# Patient Record
Sex: Male | Born: 1980 | Race: Black or African American | Hispanic: No | Marital: Single | Smoking: Current every day smoker
Health system: Southern US, Community
[De-identification: ages and names within clinical notes are randomized; demographics above are authoritative.]

## PROBLEM LIST (undated history)

## (undated) DIAGNOSIS — B20 Human immunodeficiency virus [HIV] disease: Secondary | ICD-10-CM

## (undated) DIAGNOSIS — Z21 Asymptomatic human immunodeficiency virus [HIV] infection status: Secondary | ICD-10-CM

## (undated) DIAGNOSIS — I639 Cerebral infarction, unspecified: Secondary | ICD-10-CM

---

## 2016-08-12 ENCOUNTER — Encounter (HOSPITAL_COMMUNITY): Payer: Self-pay | Admitting: *Deleted

## 2016-08-12 ENCOUNTER — Emergency Department (HOSPITAL_COMMUNITY)
Admission: EM | Admit: 2016-08-12 | Discharge: 2016-08-16 | Disposition: A | Discharge status: Home / Self Care | Payer: Self-pay | Attending: Emergency Medicine | Admitting: Emergency Medicine

## 2016-08-12 DIAGNOSIS — Z91010 Allergy to peanuts: Secondary | ICD-10-CM | POA: Insufficient documentation

## 2016-08-12 DIAGNOSIS — F17200 Nicotine dependence, unspecified, uncomplicated: Secondary | ICD-10-CM | POA: Insufficient documentation

## 2016-08-12 DIAGNOSIS — Z79899 Other long term (current) drug therapy: Secondary | ICD-10-CM | POA: Insufficient documentation

## 2016-08-12 DIAGNOSIS — Z8673 Personal history of transient ischemic attack (TIA), and cerebral infarction without residual deficits: Secondary | ICD-10-CM | POA: Insufficient documentation

## 2016-08-12 DIAGNOSIS — F39 Unspecified mood [affective] disorder: Secondary | ICD-10-CM

## 2016-08-12 DIAGNOSIS — F29 Unspecified psychosis not due to a substance or known physiological condition: Secondary | ICD-10-CM

## 2016-08-12 HISTORY — DX: Asymptomatic human immunodeficiency virus (hiv) infection status: Z21

## 2016-08-12 HISTORY — DX: Cerebral infarction, unspecified: I63.9

## 2016-08-12 HISTORY — DX: Human immunodeficiency virus (HIV) disease: B20

## 2016-08-12 LAB — COMPREHENSIVE METABOLIC PANEL
ALBUMIN: 4.2 g/dL (ref 3.5–5.0)
ALT: 16 U/L — ABNORMAL LOW (ref 17–63)
ANION GAP: 11 (ref 5–15)
AST: 21 U/L (ref 15–41)
Alkaline Phosphatase: 61 U/L (ref 38–126)
BUN: 9 mg/dL (ref 6–20)
CO2: 23 mmol/L (ref 22–32)
Calcium: 9.6 mg/dL (ref 8.9–10.3)
Chloride: 102 mmol/L (ref 101–111)
Creatinine, Ser: 1.34 mg/dL — ABNORMAL HIGH (ref 0.61–1.24)
GFR calc Af Amer: >60 mL/min (ref >60)
GFR calc non Af Amer: >60 mL/min (ref >60)
GLUCOSE: 100 mg/dL — AB (ref 65–99)
POTASSIUM: 2.7 mmol/L — AB (ref 3.5–5.1)
SODIUM: 136 mmol/L (ref 135–145)
Total Bilirubin: 0.5 mg/dL (ref 0.3–1.2)
Total Protein: 7.3 g/dL (ref 6.5–8.1)

## 2016-08-12 LAB — CBC
HCT: 40.9 % (ref 39.0–52.0)
HEMOGLOBIN: 13.5 g/dL (ref 13.0–17.0)
MCH: 29.5 pg (ref 26.0–34.0)
MCHC: 33 g/dL (ref 30.0–36.0)
MCV: 89.5 fL (ref 78.0–100.0)
PLATELETS: 248 10*3/uL (ref 150–400)
RBC: 4.57 MIL/uL (ref 4.22–5.81)
RDW: 14.4 % (ref 11.5–15.5)
WBC: 4.6 10*3/uL (ref 4.0–10.5)

## 2016-08-12 LAB — ETHANOL: Alcohol, Ethyl (B): <5 mg/dL (ref <5)

## 2016-08-12 MED ORDER — QUETIAPINE FUMARATE 200 MG PO TABS: 200.0000 mg | ORAL_TABLET | Freq: Every day | ORAL | Status: DC | PRN

## 2016-08-12 MED ORDER — ELVITEG-COBIC-EMTRICIT-TENOFAF 150-150-200-10 MG PO TABS
1.0000 | ORAL_TABLET | Freq: Every day | ORAL | Status: DC
  Administered 2016-08-13 – 2016-08-16 (×4): 1 via ORAL
  Filled 2016-08-12 (×4): qty 1

## 2016-08-12 MED ORDER — DARUNAVIR ETHANOLATE 800 MG PO TABS
800.0000 mg | ORAL_TABLET | Freq: Every day | ORAL | Status: DC
  Administered 2016-08-13 – 2016-08-16 (×4): 800 mg via ORAL
  Filled 2016-08-12 (×4): qty 1

## 2016-08-12 MED ORDER — ACETAMINOPHEN 325 MG PO TABS
650.0000 mg | ORAL_TABLET | ORAL | Status: DC | PRN
  Administered 2016-08-13: 650 mg via ORAL
  Filled 2016-08-12: qty 2

## 2016-08-12 MED ORDER — POTASSIUM CHLORIDE CRYS ER 20 MEQ PO TBCR
40.0000 meq | EXTENDED_RELEASE_TABLET | Freq: Two times a day (BID) | ORAL | Status: AC
  Administered 2016-08-12 – 2016-08-15 (×6): 40 meq via ORAL
  Filled 2016-08-12 (×7): qty 2

## 2016-08-12 MED ORDER — ONDANSETRON HCL 4 MG PO TABS: 4.0000 mg | ORAL_TABLET | Freq: Three times a day (TID) | ORAL | Status: DC | PRN

## 2016-08-12 MED ORDER — ZIPRASIDONE MESYLATE 20 MG IM SOLR
10.0000 mg | Freq: Once | INTRAMUSCULAR | Status: AC
  Administered 2016-08-12: 10 mg via INTRAMUSCULAR
  Filled 2016-08-12: qty 20

## 2016-08-12 MED ORDER — LORAZEPAM 1 MG PO TABS
1.0000 mg | ORAL_TABLET | Freq: Three times a day (TID) | ORAL | Status: DC | PRN
  Administered 2016-08-12 – 2016-08-16 (×10): 1 mg via ORAL
  Filled 2016-08-12 (×10): qty 1

## 2016-08-12 MED ORDER — IBUPROFEN 400 MG PO TABS
600.0000 mg | ORAL_TABLET | Freq: Three times a day (TID) | ORAL | Status: DC | PRN
  Administered 2016-08-12 – 2016-08-16 (×7): 600 mg via ORAL
  Filled 2016-08-12 (×7): qty 1

## 2016-08-12 NOTE — ED Notes (Signed)
Lying quietly in stretcher, awake, NAD, calmer, will continue to monitor.

## 2016-08-12 NOTE — ED Notes (Signed)
Patient unable to keep on track regarding what the problem is and why he is here.  Ranting about his boss, unsafe working conditions, set up and a plan to hurt him

## 2016-08-12 NOTE — ED Provider Notes (Signed)
[facility] DEPT Provider Note   CSN: [id number] Arrival date & time: 08/12/16  1959     History   Chief Complaint Chief Complaint  Patient presents with  . Mental Evaluation   Level V caveat due to psychiatric disorder. HPI Brian Christian is a 35 y.o. male.  HPI Patient presents for a mental evaluation. Unable to get a whole lot of history from the patient.. He does drink alcohol occasionally and smokes marijuana occasionally. States he's been having problems with his girl and his neighbors land lady. Denies hallucinations. When asked if he had a psychiatric history he said yes but will not go into it besides that. Somewhat tangential thoughts. He is HIV positive. Does not notice CD4 count is states he does take his medicines. States he had a stroke 2 weeks ago when he met this woman. States he was seen at [facility] for it. States his hand had tightened up.   Past Medical History:  Diagnosis Date  . HIV (human immunodeficiency virus infection) ([place])   . Stroke [facility])     There are no active problems to display for this patient.   No past surgical history on file.     Home Medications    Prior to Admission medications   Medication Sig Start Date End Date Taking? Authorizing Provider  Darunavir Ethanolate (PREZISTA) 800 MG tablet Take 800 mg by mouth daily.   Yes Historical Provider, MD  diazepam (VALIUM) 5 MG tablet Take 5-10 mg by mouth every 6 (six) hours as needed for anxiety.   Yes Historical Provider, MD  elvitegravir-cobicistat-emtricitabine-tenofovir (GENVOYA) 150-150-200-10 MG TABS tablet Take 1 tablet by mouth daily with breakfast.   Yes Historical Provider, MD  QUEtiapine (SEROQUEL) 400 MG tablet Take 200 mg by mouth daily as needed (sleep).   Yes Historical Provider, MD    Family History No family history on file.  Social History Social History  Substance Use Topics  . Smoking status: Current Every Day Smoker  . Smokeless tobacco: Never Used  .  Alcohol use Yes     Comment: ocassionally     Allergies   Gabapentin; Peanut-containing drug products; and Tramadol   Review of Systems Review of Systems  Unable to perform ROS: Psychiatric disorder     Physical Exam Updated Vital Signs BP 144/93 (BP Location: Right Arm)   Pulse 73   Temp 98.9 F (37.2 C) (Oral)   Resp 17   Ht _0  (1.676 m)   Wt 158 lb 6.4 oz (71.8 kg)   SpO2 100%   BMI 25.57 kg/m   Physical Exam  Constitutional: He appears well-developed.  HENT:  Head: Atraumatic.  Eyes: EOM are normal.  Neck: Neck supple.  Cardiovascular: Normal rate.   Pulmonary/Chest: Effort normal.  Abdominal: Soft.  Musculoskeletal: Normal range of motion.  Neurological: He is alert.  Skin: Skin is warm.  Psychiatric:  Pressured speech. Somewhat tearful with tangential thoughts.     ED Treatments / Results  Labs (all labs ordered are listed, but only abnormal results are displayed) Labs Reviewed  COMPREHENSIVE METABOLIC PANEL - Abnormal; Notable for the following:       Result Value   Potassium 2.7 (*)    Glucose, Bld 100 (*)    Creatinine, Ser 1.34 (*)    ALT 16 (*)    All other components within normal limits  ETHANOL  CBC  URINE RAPID DRUG SCREEN, HOSP PERFORMED  URINALYSIS, ROUTINE W REFLEX MICROSCOPIC (NOT AT [facility])  EKG  EKG Interpretation None       Radiology No results found.  Procedures Procedures (including critical care time)  Medications Ordered in ED Medications - No data to display   Initial Impression / Assessment and Plan / ED Course  I have reviewed the triage vital signs and the nursing notes.  Pertinent labs & imaging results that were available during my care of the patient were reviewed by me and considered in my medical decision making (see chart for details).  Clinical Course    Patient with likely psychiatric disorder. Has hypokalemia likely can orally be supplemented. Urine still pending but at this time is  medically cleared. To be seen by TTS. Patient apparently does have a psychiatric history and Seroquel as one of his medicines.  Final Clinical Impressions(s) / ED Diagnoses   Final diagnoses:  Psychosis, unspecified psychosis type    New Prescriptions New Prescriptions   No medications on file     Davonna Belling, MD 08/12/16 2217

## 2016-08-12 NOTE — ED Triage Notes (Signed)
Patient ranting and unable to answer questions appropriately, tearful.  Here for mental eval

## 2016-08-12 NOTE — ED Notes (Signed)
Critical K+ level called from lab 

## 2016-08-12 NOTE — ED Notes (Signed)
Pt lying in stretcher, NAD, calm, interactive, resps e/u, no dyspnea noted.

## 2016-08-12 NOTE — ED Notes (Signed)
Checked w/ Assessment @ [facility]. Approx 3 more pt's to be seen before they can do the TTS. 

## 2016-08-12 NOTE — ED Notes (Signed)
Patient again states he does not want to hurt himself or anyone else.  Patient placed back out in the waiting room.  Continues to rant about things but does not stay on track   Stated he is unable to void at this time.

## 2016-08-13 ENCOUNTER — Emergency Department (HOSPITAL_COMMUNITY): Payer: Self-pay

## 2016-08-13 DIAGNOSIS — F309 Manic episode, unspecified: Secondary | ICD-10-CM

## 2016-08-13 LAB — LIPID PANEL
CHOLESTEROL: 135 mg/dL (ref 0–200)
HDL: 45 mg/dL (ref >40)
LDL Cholesterol: 78 mg/dL (ref 0–99)
Total CHOL/HDL Ratio: 3 RATIO
Triglycerides: 61 mg/dL (ref <150)
VLDL: 12 mg/dL (ref 0–40)

## 2016-08-13 LAB — RAPID URINE DRUG SCREEN, HOSP PERFORMED
AMPHETAMINES: POSITIVE — AB
BARBITURATES: NOT DETECTED
Benzodiazepines: POSITIVE — AB
Cocaine: NOT DETECTED
Opiates: NOT DETECTED
TETRAHYDROCANNABINOL: POSITIVE — AB

## 2016-08-13 LAB — HEPATIC FUNCTION PANEL
ALBUMIN: 4 g/dL (ref 3.5–5.0)
ALK PHOS: 59 U/L (ref 38–126)
ALT: 14 U/L — ABNORMAL LOW (ref 17–63)
AST: 24 U/L (ref 15–41)
BILIRUBIN DIRECT: 0.1 mg/dL (ref 0.1–0.5)
BILIRUBIN TOTAL: 0.7 mg/dL (ref 0.3–1.2)
Indirect Bilirubin: 0.6 mg/dL (ref 0.3–0.9)
Total Protein: 7.1 g/dL (ref 6.5–8.1)

## 2016-08-13 LAB — T-HELPER CELLS (CD4) COUNT (NOT AT ARMC)
CD4 T CELL ABS: 380 /uL — AB (ref 400–2700)
CD4 T CELL HELPER: 19 % — AB (ref 33–55)

## 2016-08-13 LAB — TSH: TSH: 2.344 u[IU]/mL (ref 0.350–4.500)

## 2016-08-13 LAB — POTASSIUM: Potassium: 3.6 mmol/L (ref 3.5–5.1)

## 2016-08-13 LAB — SALICYLATE LEVEL: Salicylate Lvl: <4 mg/dL (ref 2.8–30.0)

## 2016-08-13 LAB — AMMONIA
AMMONIA: 81 umol/L — AB (ref 9–35)
Ammonia: 33 umol/L (ref 9–35)

## 2016-08-13 LAB — ACETAMINOPHEN LEVEL: Acetaminophen (Tylenol), Serum: <10 ug/mL — ABNORMAL LOW (ref 10–30)

## 2016-08-13 MED ORDER — QUETIAPINE FUMARATE 25 MG PO TABS
50.0000 mg | ORAL_TABLET | Freq: Every morning | ORAL | Status: DC
  Administered 2016-08-13 – 2016-08-15 (×3): 50 mg via ORAL
  Filled 2016-08-13 (×3): qty 2

## 2016-08-13 MED ORDER — QUETIAPINE FUMARATE ER 50 MG PO TB24: 50.0000 mg | ORAL_TABLET | ORAL | Status: DC

## 2016-08-13 MED ORDER — POTASSIUM CHLORIDE CRYS ER 20 MEQ PO TBCR
40.0000 meq | EXTENDED_RELEASE_TABLET | Freq: Once | ORAL | Status: AC
  Administered 2016-08-13: 40 meq via ORAL

## 2016-08-13 MED ORDER — GADOBENATE DIMEGLUMINE 529 MG/ML IV SOLN
15.0000 mL | Freq: Once | INTRAVENOUS | Status: AC | PRN
  Administered 2016-08-13: 15 mL via INTRAVENOUS

## 2016-08-13 MED ORDER — STERILE WATER FOR INJECTION IJ SOLN
INTRAMUSCULAR | Status: AC
  Filled 2016-08-13: qty 10

## 2016-08-13 MED ORDER — ZIPRASIDONE MESYLATE 20 MG IM SOLR
10.0000 mg | Freq: Once | INTRAMUSCULAR | Status: AC
  Administered 2016-08-13: 10 mg via INTRAMUSCULAR
  Filled 2016-08-13: qty 20

## 2016-08-13 MED ORDER — PANTOPRAZOLE SODIUM 40 MG PO TBEC
40.0000 mg | DELAYED_RELEASE_TABLET | Freq: Every day | ORAL | Status: DC
  Administered 2016-08-13 – 2016-08-16 (×4): 40 mg via ORAL
  Filled 2016-08-13 (×5): qty 1

## 2016-08-13 MED ORDER — POTASSIUM CHLORIDE IN NACL 20-0.9 MEQ/L-% IV SOLN
Freq: Once | INTRAVENOUS | Status: AC
Start: 2016-08-13 — End: 2016-08-13
  Administered 2016-08-13: 03:00:00 via INTRAVENOUS
  Filled 2016-08-13: qty 1000

## 2016-08-13 MED ORDER — NICOTINE 14 MG/24HR TD PT24
14.0000 mg | MEDICATED_PATCH | Freq: Once | TRANSDERMAL | Status: AC
  Administered 2016-08-13: 14 mg via TRANSDERMAL
  Filled 2016-08-13: qty 1

## 2016-08-13 MED ORDER — QUETIAPINE FUMARATE 25 MG PO TABS
100.0000 mg | ORAL_TABLET | Freq: Every day | ORAL | Status: DC
  Administered 2016-08-13 – 2016-08-15 (×3): 100 mg via ORAL
  Filled 2016-08-13 (×3): qty 4

## 2016-08-13 NOTE — ED Notes (Signed)
TTS in process 

## 2016-08-13 NOTE — ED Notes (Signed)
MD advised to stop Potassium

## 2016-08-13 NOTE — ED Provider Notes (Signed)
9:22 PM   Nursing came to report the patient was having some reflux. He reports he has this chronically and normally takes Prilosec. He says it was worse after he eight and he laid down causing some mild burning. Patient denies chest pain, shortness of breath, or other complaints at this time.  Patient examined and had no chest tenderness, abnormalities a lung exam, or other abnormal physical findings.  Based on options available at this facility, patient given dose of protonix.   Patient had no other complaints and was resting calmly.   Canary Brimhristopher J Lena Gores, MD 08/14/16 [phone]

## 2016-08-13 NOTE — ED Notes (Signed)
Patient having conversation with staff and laughing inappropriately

## 2016-08-13 NOTE — ED Notes (Signed)
No changes, to MRI, alert, NAD, calm, cooperative, polite.

## 2016-08-13 NOTE — ED Notes (Addendum)
[facility] at [facility], pt alert, NAD, interactive, cooperative, participatory. Speech pressured, thoughts scattered, hyperfocused on boss, persecution of self and others (particularly a neighbor child), BH contacted to initiate TTS, pending return call. Preparing to re-draw potassium.

## 2016-08-13 NOTE — ED Notes (Signed)
This RN in room with patient. Pt aroused by voice and states "I feel okay" Pt comfortable. HR sinus brady at 46. Pt does not appear to be in any distress

## 2016-08-13 NOTE — ED Notes (Signed)
Back from MRI, states, "HA is better", noted return/increase in rapid speech, pressured speech, ruminating on boss, work, terrible things, needing phone, given happy meal per request, phone use times explained, this RN listened, encouraged, reassured.

## 2016-08-13 NOTE — ED Notes (Signed)
Pt first on the list for TTS after shift change, resting quietly at this time.

## 2016-08-13 NOTE — ED Notes (Signed)
Gave pt snack  

## 2016-08-13 NOTE — ED Notes (Signed)
Sleeping, NAD, calm, no changes, VSS.

## 2016-08-13 NOTE — ED Notes (Signed)
Patient states he is having anxiety

## 2016-08-13 NOTE — ED Provider Notes (Signed)
I assumed care for Dr. Rubin PayorPickering for further evaluation by psychiatry. I have reassessed the patient and preceded with CT had to rule out any intracranial injury. Patient had history of HIV but not known to be immunocompromised. CT did show a small irregularity with recommendation for follow-up MRI. MRI shows previous stroke consistent with patient's history but no areas of abscess or other complications. Patient was also hypokalemic. This has been replaced orally and by fluids. Patient's potassium is now normalized. Patient does report at baseline he is supposed to be taking supplemental potassium but is noncompliant.  Now this morning after hydration and completion of diagnostic evaluation, vital signs are stable, patient is alert and interactive. His speech is pressured but clear and goal oriented. He does not show any signs of toxicity. His movements are coordinated purposeful symmetric.  Patient is medically cleared for psychiatric evaluation and final disposition.   Arby BarretteMarcy Lila Lufkin, MD 08/13/16 [phone]

## 2016-08-13 NOTE — ED Notes (Signed)
Back from CT & xray, remains preoccupied with sleep, no changes.

## 2016-08-13 NOTE — Progress Notes (Signed)
Patient has been recommended inpatient treatment by Dr. Jama Flavorsobos on 9/29. There are no appropriate beds for patient at [facility]. Referrals have been faxed to the following hospitals:  [facility] - per Misty StanleyStacey, accepting referrals tonight. [facility] - per Dennie BiblePat, accepting referrals. [facility] - per Marguarite ArbourPatrisa [facility] - per Lonna CobbSherry [facility] - at capacity but will look at referral  [facility] - per Cici, can not take patient due to pt being out of catchment area ([place] lives in [place] [facility] - at capacity, per [facility]  CSW will continue to follow up with placement.  Melbourne Abtsatia Tino Ronan, LCSWA Disposition staff 08/13/2016 9:31 PM

## 2016-08-13 NOTE — ED Notes (Signed)
Meal tray ordered 

## 2016-08-13 NOTE — Consult Note (Signed)
[facility] Psychiatry Consult   Reason for Consult:  Manic symptoms Referring Physician:  ED Physician Patient Identification: Brian Christian MRN:  [id number] Principal Diagnosis: Mood Disorder NOS Diagnosis:  There are no active problems to display for this patient.   Total Time spent with patient: 30 minutes  Subjective:   Brian Christian is a 35 y.o. male patient admitted with pressured speech, disorganized thought process. At this time patient presents as a poor historian . He presents with pressured speech and presents with a rambling , disorganized,  difficult to follow account of recent events.  He states that he lives alone , states he has been concerned about a neighbor's child, because the mother stole a $ 5000 dollar jewelry item from him, so he has been trying to adopt him and also a dog named " Sparky", which he has been bringing food for.  He presents preoccupied and perseverates about these topics. He does have some insight , and acknowledges his current presentation is different from his normal, and acknowledges racing thoughts. He states he has been sleeping well. He is not irritable or angry, but does present with an expansive and jovial affect , frequently laughing during our session .   HPI: Brian Christian is a 35 y.o. male patient admitted with pressured speech, disorganized thought process. At this time patient presents as a poor historian . He presents with pressured speech and presents with a rambling , disorganized,  difficult to follow account of recent events.  He states that he lives alone , states he has been concerned about a neighbor's child, because the mother stole a $ 5000 dollar jewelry item from him, so he has been trying to adopt him and also a dog named " Sparky", which he has been bringing food for.  He states he is worried because he has had " 7 strokes " over the last 2-3 days . He presents preoccupied and perseverates about these topics. He does have  some insight , and acknowledges his current presentation is different from his normal, and acknowledges racing thoughts. RN staff reports that patient himself has been requesting medication to help with his racing thoughts . He states he has been sleeping well. He is not irritable or angry, but does present with an expansive and jovial affect , frequently laughing during our session . Of note patient denies any substance abuse history, or recent substance use , but admission UDS is positive for amphetamines, benzodiazepines, and cannabis. 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  A Head CT scan done 9/29 was reported as -  Small low attenuating foci in the left basal ganglia may represent an age indeterminate infarct. An infectious process or abscess is not excluded. 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 Otherwise negative MRI head with and without contrast. I have discussed case with ED physician- considered medically cleared at present.   Past Psychiatric History: patient is denying any prior psychiatric history, states he has never before been hospitalized in a psychiatric unit, denies history of depression or prior episodes of mania , denies history of psychosis, denies history of panic or agoraphobia, denies history of violence. He does mention he had taken Seroquel in the past , cannot specify reason, but it is unclear if he was compliant . States he  found it to be sedating , but otherwise denies side effects.  PTA medication list indicates Valium 5 mgrs Q 6 hours PRN , and Seroquel 200 mgrs QHS PRN . As noted, patient is currently denying any substance abuse history but UDS is positive.  Risk to Self: Suicidal Ideation: No Suicidal Intent: No Is patient at risk for suicide?: No Suicidal Plan?: No Access to  Means: No What has been your use of drugs/alcohol within the last 12 months?: pt denies...UDS not completed Intentional Self Injurious Behavior: None Risk to Others: Homicidal Ideation: No Thoughts of Harm to Others: No Current Homicidal Intent: No Current Homicidal Plan: No Access to Homicidal Means: No History of harm to others?: No Assessment of Violence: None Noted Does patient have access to weapons?: No Criminal Charges Pending?: No Does patient have a court date: No Prior Inpatient Therapy: Prior Inpatient Therapy: Yes Prior Therapy Dates: @ 4 years ago Prior Therapy Facilty/Provider(s): [facility] Reason for Treatment: unsure Prior Outpatient Therapy: Prior Outpatient Therapy: No Does patient have an ACCT team?: Unknown Does patient have Intensive In-House Services?  : No Does patient have [facility] services? : No Does patient have P4CC services?: No  Past Medical History:  Past Medical History:  Diagnosis Date  . HIV (human immunodeficiency virus infection) ([facility])   . Stroke [facility])    No past surgical history on file. Family History: No family history on file. 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 Has one daughter, currently in college.  Denies legal difficulties.  History  Alcohol Use  . Yes    Comment: ocassionally     History  Drug Use  . Types: Marijuana    Social History   Social History  . Marital status: Single    Spouse name: N/A  . Number of children: N/A  . Years of education: N/A   Social History Main Topics  . Smoking status: Current Every Day Smoker  . Smokeless tobacco: Never Used  . Alcohol use Yes     Comment: ocassionally  . Drug use:     Types: Marijuana  . Sexual activity: Not Asked   Other Topics Concern  . None   Social History Narrative  . None   Additional Social History:    Allergies:   Allergies  Allergen Reactions  .  Gabapentin Swelling  . Peanut-Containing Drug Products Swelling  . Tramadol Other (See Comments)    dizzy    Labs:  Results for orders placed or performed during the hospital encounter of 08/12/16 (from the past 48 hour(s))  Comprehensive metabolic panel     Status: Abnormal   Collection Time: 08/12/16  8:43 PM  Result Value Ref Range   Sodium 136 135 - 145 mmol/L   Potassium 2.7 (LL) 3.5 - 5.1 mmol/L    Comment: CRITICAL RESULT CALLED TO, READ BACK BY AND VERIFIED WITH: K. FIELDS RN [phone] GREEN R    Chloride 102 101 - 111 mmol/L   CO2 23 22 - 32 mmol/L   Glucose, Bld 100 (H) 65 - 99 mg/dL   BUN 9 6 - 20 mg/dL   Creatinine, Ser 2.10 (H) 0.61 - 1.24 mg/dL   Calcium 9.6 8.9 - 37.4 mg/dL   Total Protein 7.3 6.5 - 8.1 g/dL   Albumin 4.2 3.5 - 5.0 g/dL   AST 21 15 - 41 U/L   ALT 16 (L) 17 - 63  U/L   Alkaline Phosphatase 61 38 - 126 U/L   Total Bilirubin 0.5 0.3 - 1.2 mg/dL   GFR calc non Af Amer >60 >60 mL/min   GFR calc Af Amer >60 >60 mL/min    Comment: (NOTE) The eGFR has been calculated using the CKD EPI equation. This calculation has not been validated in all clinical situations. eGFR's persistently <60 mL/min signify possible Chronic Kidney Disease.    Anion gap 11 5 - 15  cbc     Status: None   Collection Time: 08/12/16  8:43 PM  Result Value Ref Range   WBC 4.6 4.0 - 10.5 K/uL   RBC 4.57 4.22 - 5.81 MIL/uL   Hemoglobin 13.5 13.0 - 17.0 g/dL   HCT 07.2 25.7 - 50.5 %   MCV 89.5 78.0 - 100.0 fL   MCH 29.5 26.0 - 34.0 pg   MCHC 33.0 30.0 - 36.0 g/dL   RDW 18.3 35.8 - 25.1 %   Platelets 248 150 - 400 K/uL  Ethanol     Status: None   Collection Time: 08/12/16  8:44 PM  Result Value Ref Range   Alcohol, Ethyl (B) <5 <5 mg/dL    Comment:        LOWEST DETECTABLE LIMIT FOR SERUM ALCOHOL IS 5 mg/dL FOR MEDICAL PURPOSES ONLY   Acetaminophen level     Status: Abnormal   Collection Time: 08/13/16  3:00 AM  Result Value Ref Range   Acetaminophen (Tylenol),  Serum <10 (L) 10 - 30 ug/mL    Comment:        THERAPEUTIC CONCENTRATIONS VARY SIGNIFICANTLY. A RANGE OF 10-30 ug/mL MAY BE AN EFFECTIVE CONCENTRATION FOR MANY PATIENTS. HOWEVER, SOME ARE BEST TREATED AT CONCENTRATIONS OUTSIDE THIS RANGE. ACETAMINOPHEN CONCENTRATIONS >150 ug/mL AT 4 HOURS AFTER INGESTION AND >50 ug/mL AT 12 HOURS AFTER INGESTION ARE OFTEN ASSOCIATED WITH TOXIC REACTIONS.   Hepatic function panel     Status: Abnormal   Collection Time: 08/13/16  3:00 AM  Result Value Ref Range   Total Protein 7.1 6.5 - 8.1 g/dL   Albumin 4.0 3.5 - 5.0 g/dL   AST 24 15 - 41 U/L   ALT 14 (L) 17 - 63 U/L   Alkaline Phosphatase 59 38 - 126 U/L   Total Bilirubin 0.7 0.3 - 1.2 mg/dL   Bilirubin, Direct 0.1 0.1 - 0.5 mg/dL   Indirect Bilirubin 0.6 0.3 - 0.9 mg/dL  Salicylate level     Status: None   Collection Time: 08/13/16  3:00 AM  Result Value Ref Range   Salicylate Lvl <4.0 2.8 - 30.0 mg/dL  Ammonia     Status: Abnormal   Collection Time: 08/13/16  3:00 AM  Result Value Ref Range   Ammonia 81 (H) 9 - 35 umol/L  T-helper cells (CD4) count (not at [facility])     Status: Abnormal   Collection Time: 08/13/16  4:11 AM  Result Value Ref Range   CD4 T Cell Abs 380 (L) 400 - 2,700 /uL   CD4 % Helper T Cell 19 (L) 33 - 55 %    Comment: Performed at [facility]  Potassium     Status: None   Collection Time: 08/13/16  6:25 AM  Result Value Ref Range   Potassium 3.6 3.5 - 5.1 mmol/L  Ammonia     Status: None   Collection Time: 08/13/16  6:26 AM  Result Value Ref Range   Ammonia 33 9 - 35 umol/L  Rapid  urine drug screen (hospital performed)     Status: Abnormal   Collection Time: 08/13/16  2:03 PM  Result Value Ref Range   Opiates NONE DETECTED NONE DETECTED   Cocaine NONE DETECTED NONE DETECTED   Benzodiazepines POSITIVE (A) NONE DETECTED   Amphetamines POSITIVE (A) NONE DETECTED   Tetrahydrocannabinol POSITIVE (A) NONE DETECTED   Barbiturates NONE DETECTED  NONE DETECTED    Comment:        DRUG SCREEN FOR MEDICAL PURPOSES ONLY.  IF CONFIRMATION IS NEEDED FOR ANY PURPOSE, NOTIFY LAB WITHIN 5 DAYS.        LOWEST DETECTABLE LIMITS FOR URINE DRUG SCREEN Drug Class       Cutoff (ng/mL) Amphetamine      1000 Barbiturate      200 Benzodiazepine   200 Tricyclics       300 Opiates          300 Cocaine          300 THC              50     Current Facility-Administered Medications  Medication Dose Route Frequency Provider Last Rate Last Dose  . acetaminophen (TYLENOL) tablet 650 mg  650 mg Oral Q4H PRN Benjiman Core, MD   650 mg at 08/13/16 0254  . Darunavir Ethanolate (PREZISTA) tablet 800 mg  800 mg Oral Q breakfast Benjiman Core, MD   Stopped at 08/13/16 [phone]  . elvitegravir-cobicistat-emtricitabine-tenofovir (GENVOYA) 150-150-200-10 MG tablet 1 tablet  1 tablet Oral Q breakfast Benjiman Core, MD   Stopped at 08/13/16 0815  . ibuprofen (ADVIL,MOTRIN) tablet 600 mg  600 mg Oral Q8H PRN Benjiman Core, MD   600 mg at 08/13/16 1148  . LORazepam (ATIVAN) tablet 1 mg  1 mg Oral Q8H PRN Benjiman Core, MD   1 mg at 08/13/16 0548  . nicotine (NICODERM CQ - dosed in mg/24 hours) patch 14 mg  14 mg Transdermal Once Gerhard Munch, MD   14 mg at 08/13/16 1356  . ondansetron (ZOFRAN) tablet 4 mg  4 mg Oral Q8H PRN Benjiman Core, MD      . potassium chloride SA (K-DUR,KLOR-CON) CR tablet 40 mEq  40 mEq Oral BID Benjiman Core, MD   40 mEq at 08/13/16 1014  . QUEtiapine (SEROQUEL) tablet 200 mg  200 mg Oral Daily PRN Benjiman Core, MD      . sterile water (preservative free) injection            Current Outpatient Prescriptions  Medication Sig Dispense Refill  . Darunavir Ethanolate (PREZISTA) 800 MG tablet Take 800 mg by mouth daily.    . diazepam (VALIUM) 5 MG tablet Take 5-10 mg by mouth every 6 (six) hours as needed for anxiety.    . elvitegravir-cobicistat-emtricitabine-tenofovir (GENVOYA) 150-150-200-10 MG TABS tablet Take 1  tablet by mouth daily with breakfast.    . QUEtiapine (SEROQUEL) 400 MG tablet Take 200 mg by mouth daily as needed (sleep).      Musculoskeletal: Strength & Muscle Tone: within normal limits Gait & Station: gait not examined  Patient leans: N/A  Psychiatric Specialty Exam: Physical Exam  ROS denies headache, denies chest pain, denies shortness of breath, denies vomiting   Blood pressure 119/70, pulse 71, temperature 98.9 F (37.2 C), temperature source Oral, resp. rate 17, height 5\' 6"  (1.676 m), weight 158 lb 6.4 oz (71.8 kg), SpO2 100 %.Body mass index is 25.57 kg/m.  General Appearance: fairly groomed in hospital garb   Eye  Contact:  Good  Speech:  Pressured  Volume:  Normal  Mood:  states mood is " good "  Affect:  expansive, overly jovial   Thought Process:  Disorganized  Orientation:  Full (Time, Place, and Person)  Thought Content:  denies hallucinations , does not appear internally preoccupied   Suicidal Thoughts:  No denies any suicidal or self injurious ideations, denies any homicidal or violent ideations  Homicidal Thoughts:  No  Memory:  recent and remote grossly intact   Judgement:  Impaired  Insight:  Fair   Psychomotor Activity:  Mildly restless, exaggerated hand gestures   Concentration:  Concentration: Fair and Attention Span: Fair  Recall:  Good  Fund of Knowledge:  Good  Language:  Good  Akathisia:  Negative  Handed:  Right  AIMS (if indicated):     Assets:  Desire for Improvement Resilience  ADL's:   Fair   Cognition:  WNL  Sleep:       Assessment - patient is presenting with symptoms of mania . He denies prior psychiatric history  or substance abuse,but he presents as fair historian, and chart notes indicate he had been prescribed Seroquel, and UDS is positive for cannabis and amphetamines . Would consider stimulant induced mania as a potential etiology.  Treatment Plan Summary: as below   Disposition: Recommend psychiatric Inpatient admission  when medically cleared. -  At this time continue SEROQUEL 50 mgrs QAM and 100 mgrs QHS   -  Continue ATIVAN 1 mgr Q 6 hours PRN for agitation   - please  Obtain Lipid panel, Prolactin, HgbA1C,  EKG to monitor QTc, ( routine as on Seroquel ) , and TSH  Neita Garnet, MD 08/13/2016 3:01 PM

## 2016-08-13 NOTE — BH Assessment (Signed)
[facility] Assessment Progress Note  Unable to assess patient due to recent dose of geodon. Will call back when patient is alert.

## 2016-08-13 NOTE — ED Notes (Signed)
Patient aware we need a Urinalysis, but unable to void at this time. 

## 2016-08-13 NOTE — ED Notes (Signed)
Meal eaten, potassium taken, pt cooperative, polite, NAD, preoccupied with sleep at this time, to xray and CT via stretcher. Pt agreeable, verbalizes understanding.

## 2016-08-13 NOTE — ED Notes (Signed)
Dr. Low[facility]Pheiffer in to see pt[facility]

## 2016-08-13 NOTE — ED Notes (Signed)
Moved to room C29, sitting upright eating, VSS, no dyspnea, alert, interactive, voice mumbling and clear, NAD, calm.

## 2016-08-13 NOTE — ED Notes (Signed)
RN spoke with MD advised patient can have ibuprofen for pain.

## 2016-08-13 NOTE — ED Provider Notes (Signed)
MEDICALLY CLEAR FOR TRANSFER OR PSYCHIATRIC ADMISSION.    Lyndal Pulleyaniel Kennidee Heyne, MD 08/13/16 [id number]

## 2016-08-13 NOTE — ED Notes (Signed)
Patient was given 2 drinks and snack, and double portions ordered for lunch.
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
## 2016-08-13 NOTE — ED Notes (Signed)
VSS. Pt sleeping, NAD, calm, arousable to voice, rise and fall of chest noted, repositions self, lying on L side in fetal position.

## 2016-08-14 ENCOUNTER — Encounter (HOSPITAL_COMMUNITY): Payer: Self-pay

## 2016-08-14 MED ORDER — LORAZEPAM 1 MG PO TABS
1.0000 mg | ORAL_TABLET | Freq: Once | ORAL | Status: AC
  Administered 2016-08-14: 1 mg via ORAL
  Filled 2016-08-14: qty 1

## 2016-08-14 MED ORDER — NICOTINE 21 MG/24HR TD PT24
21.0000 mg | MEDICATED_PATCH | Freq: Every day | TRANSDERMAL | Status: DC
  Administered 2016-08-14 – 2016-08-16 (×3): 21 mg via TRANSDERMAL
  Filled 2016-08-14 (×3): qty 1

## 2016-08-14 NOTE — ED Notes (Signed)
Pt requesting ativan  given

## 2016-08-14 NOTE — ED Notes (Addendum)
Pt eating breakfast. Pt noted w/rapid speech. States he was sent to ED from his work d/t upset about neighbor's 10-y-o son who he wants to adopt. States he is aware he is in the ED and states "as long as I am out of here by Tuesday, I'll be OK". States usually takes Valium but has been receiving Ativan while in ED and feels it is working better. States was afraid he may not sleep at night d/t not being given his usual dosage Seroquel 400mg  but slept well.

## 2016-08-14 NOTE — ED Notes (Signed)
Discussed the ativan given at 1934 with dr Jeraldine Lootslockwood

## 2016-08-14 NOTE — ED Notes (Signed)
Pt eating another plate of food

## 2016-08-14 NOTE — ED Notes (Signed)
[facility] Simon[facility] ComeLeo[facility] looking for [facility].

## 2016-08-14 NOTE — ED Notes (Signed)
Pt at desk very vocally upset and worried about neighbors child. Pt has request to talk to Social Worker on Monday for a welfare check on the child. Pt states he is going to use the bathroom then try to get some sleep the rest of the night.

## 2016-08-14 NOTE — ED Notes (Signed)
Lunch delivered. 

## 2016-08-14 NOTE — ED Notes (Signed)
Pt aware pecans are in carrot cake, as requested by [employer]. Pt states he can eat pecans and voiced appreciation.

## 2016-08-14 NOTE — ED Notes (Signed)
Pt c/o headache medication orders given as per PRN order pt sitting in room appears in no distress

## 2016-08-14 NOTE — ED Notes (Signed)
Patient became upset because he wasn't allowed to have any more soda... Patient was informed about the rules in Pod C.... Patient was offered water and declined stating that medically he wasn't able to drink water due to being an ex cancer patient..Marland Kitchen

## 2016-08-14 NOTE — ED Notes (Signed)
Pt given Medical Clearance pt policy form and verbalized understanding of policies. States he does not like to drink water. Pt also given sandwich and cup of ice as requested for snack. Pt has Coke at bedside. Lunch order request received from pt - ordered.

## 2016-08-14 NOTE — ED Notes (Signed)
Pt taking shower.  

## 2016-08-14 NOTE — ED Notes (Signed)
Pt eating sandwich given earlier - asking for Coke and ice. Advised pt may have Coke when lunch tray arrives - ice chips given as requested.

## 2016-08-15 LAB — HEMOGLOBIN A1C
HEMOGLOBIN A1C: 5.7 % — AB (ref 4.8–5.6)
Mean Plasma Glucose: 117 mg/dL

## 2016-08-15 NOTE — ED Notes (Signed)
Pt ambulatory to nurses' desk - asking if he has to wait until he is discharged to get his cell phone back. Advised pt yes. States wants "to be released from hospital tomorrow because I'll get my check". States has appt on Tues at 11:30am and wants to make sure he is out tomorrow so he can make it to that appt. States he "may want to go ahead and take my Seroquel so I can get some rest but then again, maybe I'll wait because I'm watching a funny movie". Requested for RN to "let them know I'll be ready to leave in the morning". Pt then returned to his room.

## 2016-08-15 NOTE — ED Notes (Signed)
Snack given: [place] sandwich Lunch order taken

## 2016-08-15 NOTE — ED Notes (Signed)
Pt on phone at nurses' desk. Advised pt moving to room C21 - voiced understanding.
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
## 2016-08-15 NOTE — ED Notes (Signed)
Pt's mother, Kari BaarsRachel Penn, called - advised her pt may return her call in am d/t has had phone calls for the day - [phone]. Advised she will call back in the morning.

## 2016-08-15 NOTE — ED Notes (Signed)
Requesting to take a shower.  Linen and supplies provided.  No distress noted at this time.

## 2016-08-15 NOTE — ED Notes (Signed)
Pt has been pressing call bell multiple times this am asking repetitive questions. Pt given comb as requested when called out this time.

## 2016-08-15 NOTE — ED Notes (Addendum)
Pt noted to be anxious this am. Voicing concern re: being given Ativan 1mg  rather than Valium 10mg  and being given Seroquel 50mg  in am. Is requesting to be given extra Ativan "to keep me calm" and that the Seroquel makes him feel too sleepy during the day. States wants to take at least 200mg  qhs rather than 100mg . States he feels OK today and is requesting to be able to be d/c'd to home. Advised pt will request TTS to consult. Voiced understanding. Pt then called RN back into room to discuss same concerns. RN allowed pt to vent feelings/concerns and advised will consult w/TTS. Voiced understanding. Shower supplies given as requested. Pt took shower around 0300 this am but is requesting to take 2nd. Pt now resting on bed while waiting for shower to be cleaned.

## 2016-08-15 NOTE — ED Notes (Addendum)
Pt noted to be anxious - asking for Ativan repeatedly. States he has been awake since 0500 this am and needs it to relax - however, pt refusing Seroquel d/t states makes him sleepy and too relaxed.

## 2016-08-15 NOTE — ED Notes (Signed)
Pt on phone at nurses' desk - advised pt has been on phone over 5-minute limit. Pt cooperative and returned to room as asked by RN.

## 2016-08-15 NOTE — ED Notes (Signed)
Ordered breakfast tray at 0550- TY

## 2016-08-16 DIAGNOSIS — F17210 Nicotine dependence, cigarettes, uncomplicated: Secondary | ICD-10-CM

## 2016-08-16 DIAGNOSIS — F1099 Alcohol use, unspecified with unspecified alcohol-induced disorder: Secondary | ICD-10-CM

## 2016-08-16 DIAGNOSIS — Z79899 Other long term (current) drug therapy: Secondary | ICD-10-CM

## 2016-08-16 DIAGNOSIS — Z791 Long term (current) use of non-steroidal anti-inflammatories (NSAID): Secondary | ICD-10-CM

## 2016-08-16 DIAGNOSIS — F39 Unspecified mood [affective] disorder: Secondary | ICD-10-CM

## 2016-08-16 DIAGNOSIS — F1290 Cannabis use, unspecified, uncomplicated: Secondary | ICD-10-CM

## 2016-08-16 LAB — PROLACTIN: PROLACTIN: 8.6 ng/mL (ref 4.0–15.2)

## 2016-08-16 MED ORDER — QUETIAPINE FUMARATE 50 MG PO TABS: ORAL_TABLET | ORAL | 0 refills | Status: DC

## 2016-08-16 NOTE — ED Provider Notes (Signed)
10:56 AM Patient does not appear acutely psychotic. He is resting comfortably. We'll place back on Seroquel, 500 in the morning and 100 mg at night per the psychiatry recommendations. However he no longer meets inpatient criteria per them. No thoughts of homicidal or suicidal thoughts. Follow-up with psychiatry.   Pricilla LovelessScott Nachum Derossett, MD 08/16/16 1057

## 2016-08-16 NOTE — ED Notes (Signed)
Patient was given a snack and drink, and a regular diet ordered for lunch. 
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
## 2017-04-07 IMAGING — CT CT HEAD W/O CM
3 series · 15 of 47 positions shown, 18 images · non-contrast
Comparison: None.

CLINICAL DATA: 35-year-old male with confusion. History of stroke
and HIV.

EXAM:
CT HEAD WITHOUT CONTRAST
TECHNIQUE: Contiguous axial images were obtained from the base of the skull
through the vertex without intravenous contrast.

[Series 2: head 5.0 h30s · axial · 0.41mm/px · z∈[-56,+74]mm · 9 of 32 slices shown, 12 images]
[im 3/32  brain]
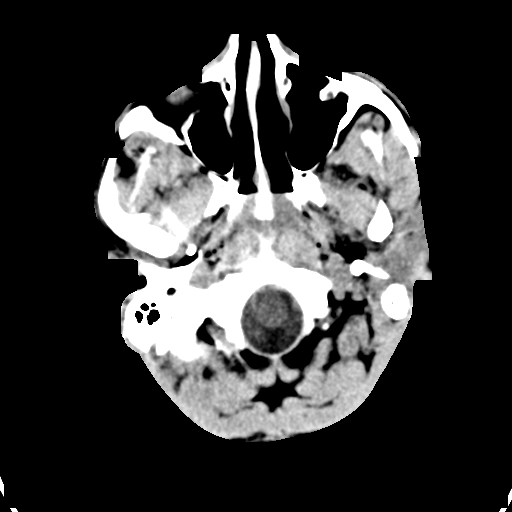
[im 3/32  bone]
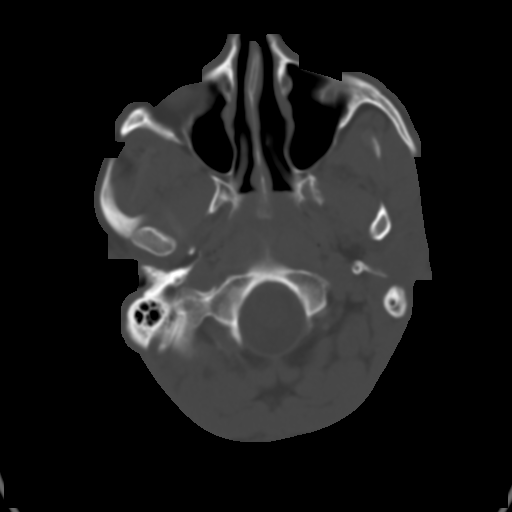
[im 6/32  brain]
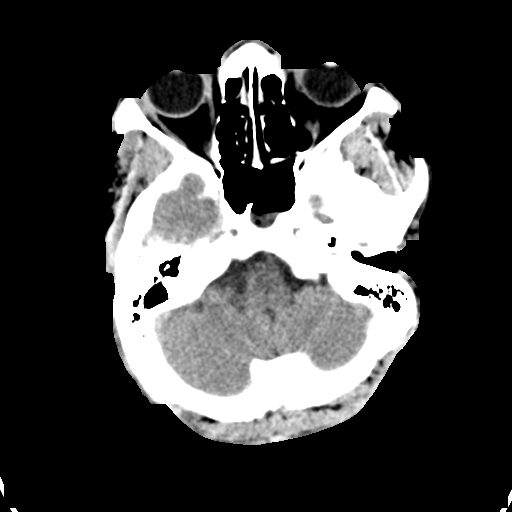
[im 9/32  brain]
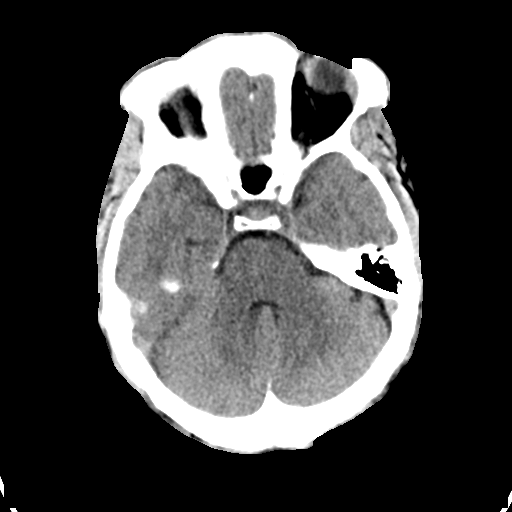
[im 12/32  brain]
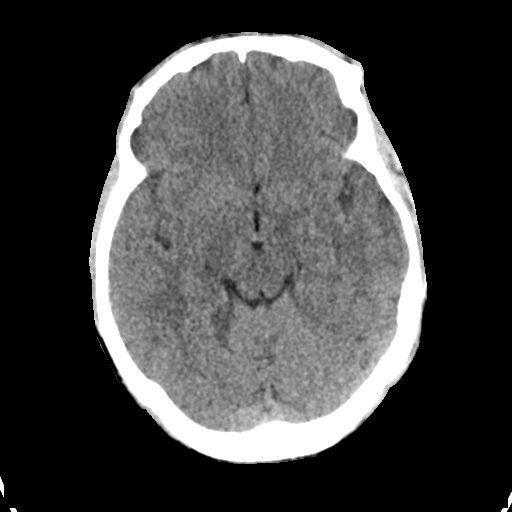
[im 17/32  brain]
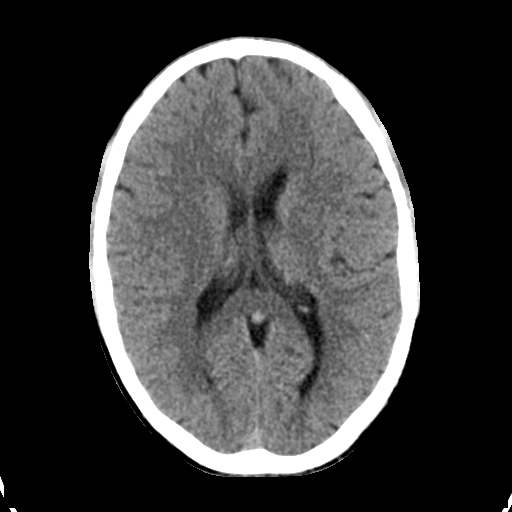
[im 17/32  bone]
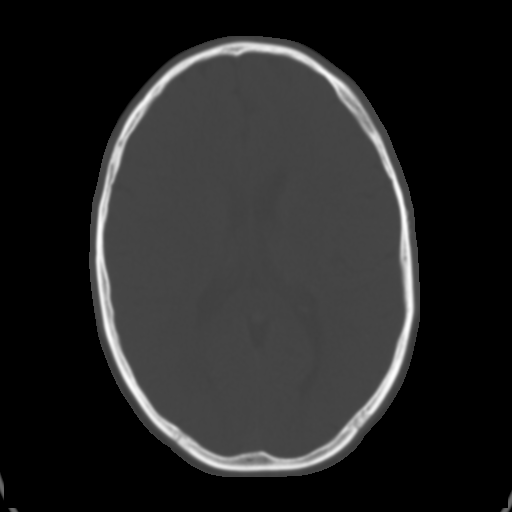
[im 20/32  brain]
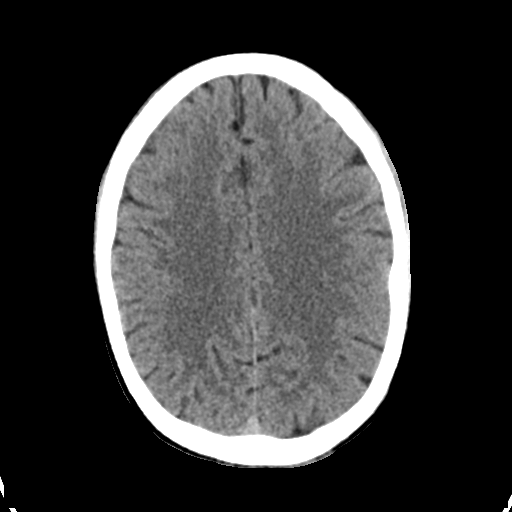
[im 23/32  brain]
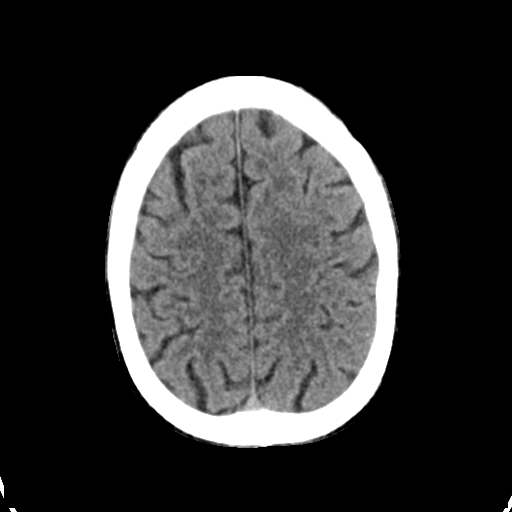
[im 26/32  brain]
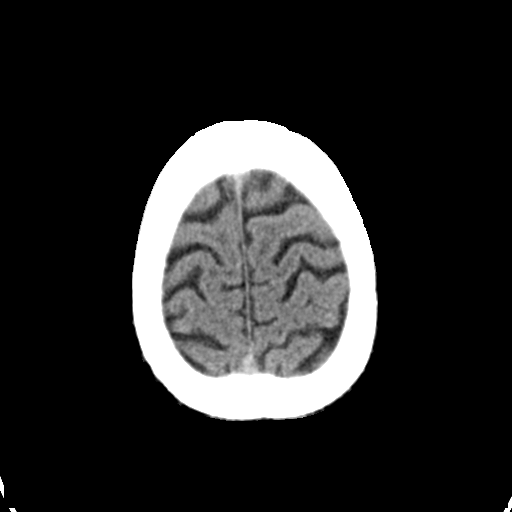
[im 29/32  brain]
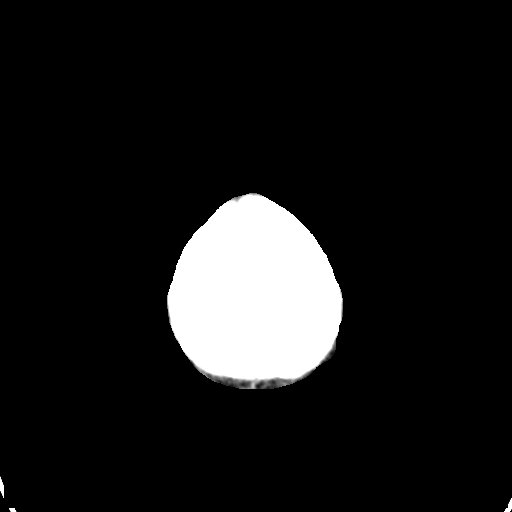
[im 29/32  bone]
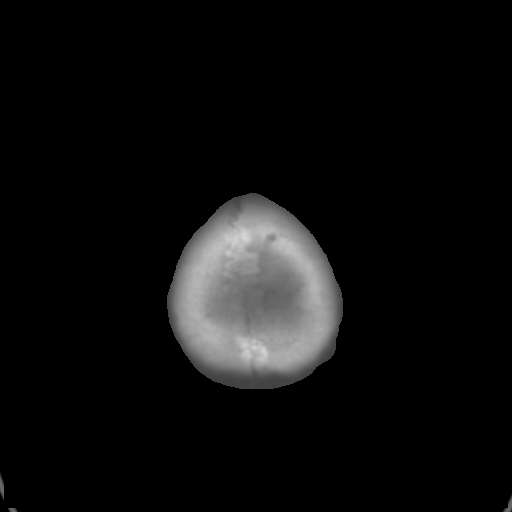

[Series 4: head 3.0 mpr cor · coronal · 0.31mm/px · 3 of 67 slices shown]
[im 23/67  brain]
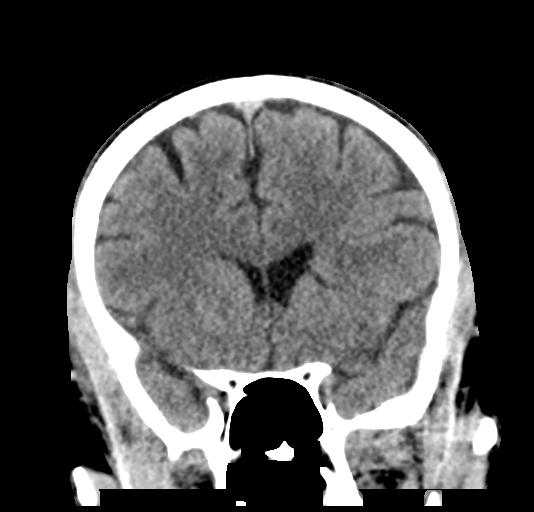
[im 30/67  brain]
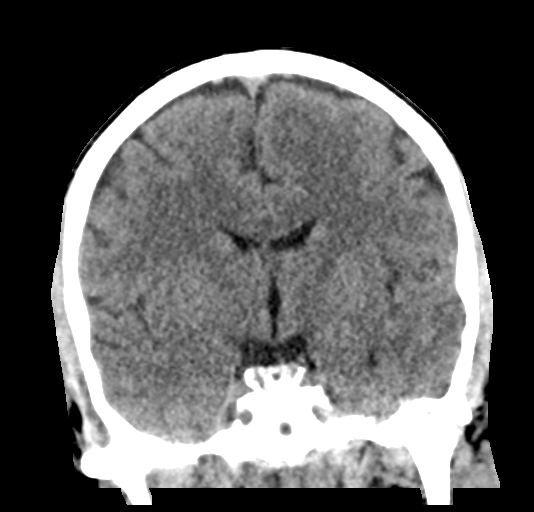
[im 37/67  brain]
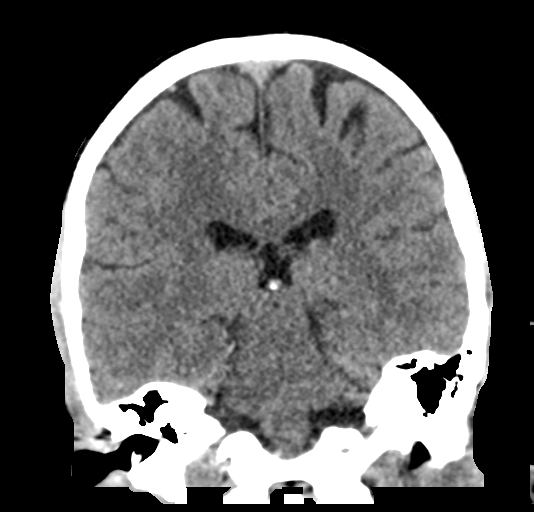

[Series 5: head 3.0 mpr sag · sagittal · 0.31mm/px · 3 of 67 slices shown]
[im 23/67  brain]
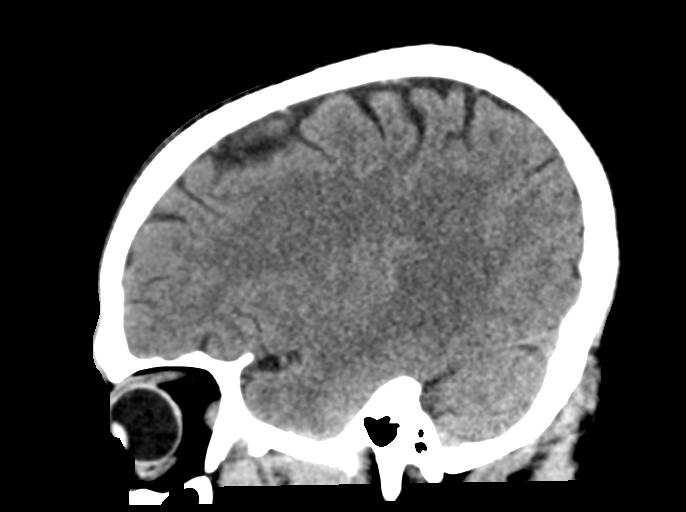
[im 34/67  brain]
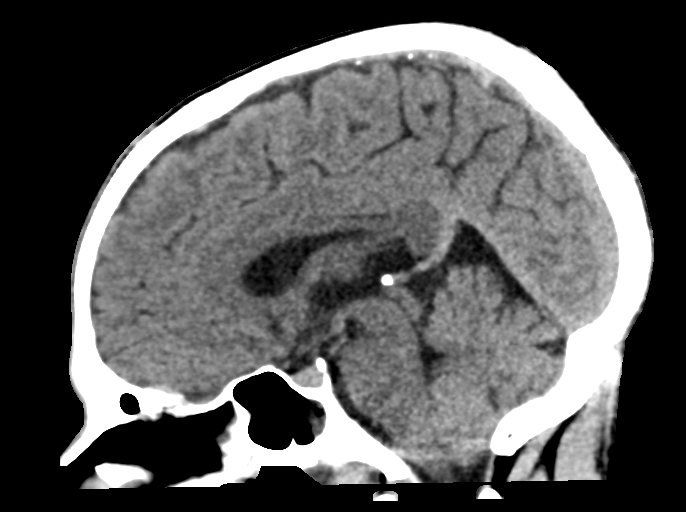
[im 45/67  brain]
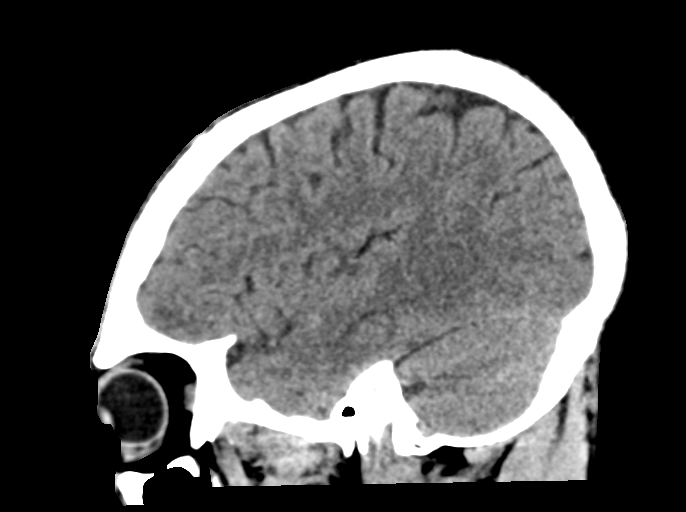

[15 of 47 positions shown; findings below may reference images not displayed]

FINDINGS: Brain: The ventricles and sulci are appropriate in size for
patient's age. There is no acute intracranial hemorrhage. There is a
14 x 9 mm hypodense focus in the left lentiform nucleus. A smaller
low attenuating focus is noted in the left caudate. These may
represent an age indeterminate infarct. An infectious process or
abscess is not excluded. MRI without and with contrast is
recommended for further evaluation. There is no extra-axial fluid
collection. No mass effect or midline shift.

Vascular: No hyperdense vessel or unexpected calcification.

Skull: Normal. Negative for fracture or focal lesion.

Sinuses/Orbits: There is mild mucoperiosteal thickening of paranasal
sinuses with partial opacification of the left sphenoid sinus. The
mastoid air cells are clear.

Other: None
IMPRESSION: No acute intracranial hemorrhage.

Small low attenuating foci in the left basal ganglia may represent
an age indeterminate infarct. An infectious process or abscess is
not excluded. Clinical correlation is recommended. MRI without and
with contrast is recommended for further evaluation.

These results were called by telephone at the time of interpretation
on 08/13/2016 at [DATE] to physician Vesna Nisha Supovec who verbally
acknowledged these results.

## 2023-03-01 ENCOUNTER — Encounter (HOSPITAL_COMMUNITY): Payer: Self-pay

## 2023-03-01 ENCOUNTER — Other Ambulatory Visit: Payer: Self-pay

## 2023-03-01 ENCOUNTER — Emergency Department (HOSPITAL_COMMUNITY)
Admission: EM | Admit: 2023-03-01 | Discharge: 2023-03-04 | Disposition: A | Discharge status: Other Acute Inpatient Hospital | Payer: Medicare HMO | Attending: Emergency Medicine | Admitting: Emergency Medicine

## 2023-03-01 ENCOUNTER — Emergency Department (HOSPITAL_COMMUNITY): Payer: Medicaid - Out of State

## 2023-03-01 DIAGNOSIS — F29 Unspecified psychosis not due to a substance or known physiological condition: Secondary | ICD-10-CM | POA: Diagnosis not present

## 2023-03-01 DIAGNOSIS — Z21 Asymptomatic human immunodeficiency virus [HIV] infection status: Secondary | ICD-10-CM | POA: Insufficient documentation

## 2023-03-01 DIAGNOSIS — Z1152 Encounter for screening for COVID-19: Secondary | ICD-10-CM | POA: Diagnosis not present

## 2023-03-01 DIAGNOSIS — F209 Schizophrenia, unspecified: Secondary | ICD-10-CM | POA: Diagnosis not present

## 2023-03-01 DIAGNOSIS — M79672 Pain in left foot: Secondary | ICD-10-CM | POA: Diagnosis present

## 2023-03-01 DIAGNOSIS — F17210 Nicotine dependence, cigarettes, uncomplicated: Secondary | ICD-10-CM | POA: Insufficient documentation

## 2023-03-01 DIAGNOSIS — R531 Weakness: Secondary | ICD-10-CM | POA: Diagnosis not present

## 2023-03-01 DIAGNOSIS — Z8673 Personal history of transient ischemic attack (TIA), and cerebral infarction without residual deficits: Secondary | ICD-10-CM | POA: Insufficient documentation

## 2023-03-01 DIAGNOSIS — F23 Brief psychotic disorder: Secondary | ICD-10-CM | POA: Diagnosis not present

## 2023-03-01 DIAGNOSIS — M79675 Pain in left toe(s): Secondary | ICD-10-CM | POA: Diagnosis not present

## 2023-03-01 DIAGNOSIS — R079 Chest pain, unspecified: Secondary | ICD-10-CM | POA: Diagnosis present

## 2023-03-01 LAB — BASIC METABOLIC PANEL
Anion gap: 10 (ref 5–15)
BUN: 33 mg/dL — ABNORMAL HIGH (ref 6–20)
CO2: 22 mmol/L (ref 22–32)
Calcium: 8.9 mg/dL (ref 8.9–10.3)
Chloride: 106 mmol/L (ref 98–111)
Creatinine, Ser: 0.99 mg/dL (ref 0.61–1.24)
GFR, Estimated: >60 mL/min (ref >60)
Glucose, Bld: 118 mg/dL — ABNORMAL HIGH (ref 70–99)
Potassium: 3.3 mmol/L — ABNORMAL LOW (ref 3.5–5.1)
Sodium: 138 mmol/L (ref 135–145)

## 2023-03-01 LAB — CBC
HCT: 46 % (ref 39.0–52.0)
Hemoglobin: 14.9 g/dL (ref 13.0–17.0)
MCH: 29.4 pg (ref 26.0–34.0)
MCHC: 32.4 g/dL (ref 30.0–36.0)
MCV: 90.9 fL (ref 80.0–100.0)
Platelets: 223 10*3/uL (ref 150–400)
RBC: 5.06 MIL/uL (ref 4.22–5.81)
RDW: 13.5 % (ref 11.5–15.5)
WBC: 7.3 10*3/uL (ref 4.0–10.5)
nRBC: 0 % (ref 0.0–0.2)

## 2023-03-01 LAB — CBG MONITORING, ED: Glucose-Capillary: 106 mg/dL — ABNORMAL HIGH (ref 70–99)

## 2023-03-01 LAB — ETHANOL: Alcohol, Ethyl (B): <10 mg/dL (ref <10)

## 2023-03-01 MED ORDER — DARUNAVIR 800 MG PO TABS
800.0000 mg | ORAL_TABLET | Freq: Every day | ORAL | Status: DC
  Administered 2023-03-03 – 2023-03-04 (×2): 800 mg via ORAL
  Filled 2023-03-01 (×3): qty 1

## 2023-03-01 MED ORDER — POTASSIUM CHLORIDE CRYS ER 20 MEQ PO TBCR
EXTENDED_RELEASE_TABLET | ORAL | Status: AC
  Filled 2023-03-01: qty 2

## 2023-03-01 MED ORDER — ELVITEG-COBIC-EMTRICIT-TENOFAF 150-150-200-10 MG PO TABS
1.0000 | ORAL_TABLET | Freq: Every day | ORAL | Status: DC
  Administered 2023-03-02 – 2023-03-04 (×3): 1 via ORAL
  Filled 2023-03-01 (×3): qty 1

## 2023-03-01 MED ORDER — DARUNAVIR ETHANOLATE 800 MG PO TABS: 800.0000 mg | ORAL_TABLET | Freq: Every day | ORAL | Status: DC

## 2023-03-01 MED ORDER — ACETAMINOPHEN 325 MG PO TABS
650.0000 mg | ORAL_TABLET | Freq: Once | ORAL | Status: AC
  Administered 2023-03-01: 650 mg via ORAL
  Filled 2023-03-01: qty 2

## 2023-03-01 MED ORDER — POTASSIUM CHLORIDE CRYS ER 20 MEQ PO TBCR
40.0000 meq | EXTENDED_RELEASE_TABLET | Freq: Once | ORAL | Status: AC
  Administered 2023-03-01: 40 meq via ORAL
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
## 2023-03-02 DIAGNOSIS — F29 Unspecified psychosis not due to a substance or known physiological condition: Secondary | ICD-10-CM | POA: Diagnosis present

## 2023-03-02 DIAGNOSIS — R531 Weakness: Secondary | ICD-10-CM | POA: Diagnosis not present

## 2023-03-02 DIAGNOSIS — F209 Schizophrenia, unspecified: Secondary | ICD-10-CM | POA: Diagnosis not present

## 2023-03-02 LAB — RAPID URINE DRUG SCREEN, HOSP PERFORMED
Amphetamines: NOT DETECTED
Barbiturates: NOT DETECTED
Benzodiazepines: NOT DETECTED
Cocaine: POSITIVE — AB
Opiates: NOT DETECTED
Tetrahydrocannabinol: POSITIVE — AB

## 2023-03-02 LAB — SARS CORONAVIRUS 2 BY RT PCR: SARS Coronavirus 2 by RT PCR: NEGATIVE

## 2023-03-02 MED ORDER — BUSPIRONE HCL 10 MG PO TABS
15.0000 mg | ORAL_TABLET | Freq: Two times a day (BID) | ORAL | Status: DC
  Administered 2023-03-02 – 2023-03-04 (×4): 15 mg via ORAL
  Filled 2023-03-02 (×4): qty 2

## 2023-03-02 MED ORDER — QUETIAPINE FUMARATE 100 MG PO TABS
100.0000 mg | ORAL_TABLET | Freq: Every day | ORAL | Status: DC
  Administered 2023-03-02 – 2023-03-03 (×2): 100 mg via ORAL
  Filled 2023-03-02 (×2): qty 1

## 2023-03-02 MED ORDER — DIVALPROEX SODIUM 500 MG PO DR TAB
500.0000 mg | DELAYED_RELEASE_TABLET | Freq: Two times a day (BID) | ORAL | Status: DC
  Administered 2023-03-02 – 2023-03-04 (×4): 500 mg via ORAL
  Filled 2023-03-02 (×4): qty 1

## 2023-03-02 MED ORDER — QUETIAPINE FUMARATE 50 MG PO TABS
50.0000 mg | ORAL_TABLET | Freq: Every day | ORAL | Status: DC
  Filled 2023-03-02: qty 1

## 2023-03-02 NOTE — ED Notes (Signed)
Pt scrubbed out and four bags placed in cabinet 16-18

## 2023-03-02 NOTE — ED Notes (Signed)
Called security to wand pt - pt refusing to be wanded. Pt also threatening staff and refusing to allow [place] to take his belongings. Belongings retrieved and multiple security officers at bedside to attempt to wand pt. Pt has 4 pt belonging bags in cabinet at nurses station.

## 2023-03-02 NOTE — ED Notes (Signed)
Pt getting irritable, pt demanding sandwich and coke, advise per policy he may have water, no snacks after 9 pm, Brian Christian did have his meal tray at his bedside this evening and ate sporadically up until around 2130 when he asked for his bedside table to be cleared.  Pt pointed finger at this RN and stated "you be quite or you gonna lose your job" and look at sitter NT and stated "go get my my sandwich with coke and ice", pt again advised he may have ice water to drink and that is all at this time of night. Security within unit and observed pt's behavior and violent outburst.  Pt back in room, shut his door, NT provided pt with ice water, waiting on order for Tylenol.

## 2023-03-02 NOTE — Progress Notes (Signed)
Patient has been denied by [facility] due to no appropriate beds available. Patient meets BH inpatient criteria per Alona Bene, NP. Patient has been faxed out to the following facilities:   [facility]  [address] [place] [place] [address] [phone]  [facility]  [address] [place] [address] [phone]  [facility]  [address] [place] [address] [phone]  [facility]  [address] [place] [address] [phone]  [facility]  [address], [place] [address] [phone]  [facility]  [address] [place] [address] [phone]  [facility]  [address] [place], [place] [address] [phone]  [facility]  [address] [place], [place] [address] [phone]  [facility]  [address] [place] [address] [phone]  [facility]  [address], [place] [address] [phone]  [facility]  [address], [place] [address] [phone]   Damita Dunnings, MSW, LCSW-A  10:02 PM 03/02/2023

## 2023-03-02 NOTE — BH Assessment (Signed)
Per RN, Pt is unable to participate in tele-assessment at this time. He was agitated earlier and now sleeping.   Pamalee Leyden, [facility], [facility] Triage Specialist [phone]

## 2023-03-02 NOTE — Consult Note (Cosign Needed Addendum)
[facility] ED ASSESSMENT   Reason for Consult:  Psych Consult Referring Physician:  Dr. Theresia Lo Patient Identification: Brian Christian MRN:  [id number] ED Chief Complaint: <principal problem not specified>  Diagnosis:  Active Problems:   Psychosis   ED Assessment Time Calculation: Start Time: 1130 Stop Time: 1200 Total Time in Minutes (Assessment Completion): 30    HPI: Per Triage Note: BIBA from apartment complex laying at the dumpster this am.  C/o weakness. Pt requesting something to drink.    Subjective: Brian Christian, 42 y.o., male patient seen face to face by this provider, consulted with Dr. Lucianne Muss; and chart reviewed on 03/02/23.  On evaluation Brian Christian reports that he does not need to be here at the hospital. Patient irritable, and speaks at rapid rate, speech tangential.  At this time patient presents as a poor historian.  Patient states that he was staying at the [employer] in [place], states he had to leave because he has HIV and likes men.  Patient unable to tell provider why he was here, provider asked him if he remembered being found by a trash can and he stated no, he denies SI/HI/AVH.  Provider asked patient who gave him his Invega injections and he stated he did not know. Pt gives brief answers to all questions and appears uninterested in participating.  Patient UDS positive for cocaine and THC.   During evaluation Brian Christian is laying in his bed, in a curled up position in no acute distress. He is alert, oriented x 4, calm, cooperative and attentive. His mood is irritable with congruent affect. He has rapid, tangential speech, and irritable behavior.  He denies suicidal/self-harm/homicidal ideation, psychosis, and paranoia.  Appears patient may be presenting with some mania, he does, prior history of substance abuse, patient is a poor historian at this time.  Patient UDS was positive for cocaine and THC.  Patient has a past diagnosis of schizophrenia.  Per chart review patient  was released from prison on April 8 and states that he does not currently have a place to stay.   Risk to Self:  Yes Risk to Others:   Yes Prior Inpatient Therapy:  Yes  Prior Outpatient Therapy:  No     [place] Scale:  Flowsheet Row ED from 03/01/2023 in [facility] Emergency Department at [facility]  C-SSRS RISK CATEGORY No Risk       AIMS:  , , ,  ,   ASAM:    Substance Abuse:     Past Medical History:  Past Medical History:  Diagnosis Date   HIV (human immunodeficiency virus infection)    Stroke    History reviewed. No pertinent surgical history. Family History: History reviewed. No pertinent family history.   Social History:  Social History   Substance and Sexual Activity  Alcohol Use Yes   Comment: ocassionally     Social History   Substance and Sexual Activity  Drug Use Yes   Types: Marijuana    Social History   Socioeconomic History   Marital status: Single    Spouse name: Not on file   Number of children: Not on file   Years of education: Not on file   Highest education level: Not on file  Occupational History   Not on file  Tobacco Use   Smoking status: Every Day    Packs/day: .5    Types: Cigarettes   Smokeless tobacco: Never  Substance and Sexual Activity   Alcohol use: Yes  Comment: ocassionally   Drug use: Yes    Types: Marijuana   Sexual activity: Not on file  Other Topics Concern   Not on file  Social History Narrative   Not on file   Social Determinants of Health   Financial Resource Strain: Not on file  Food Insecurity: Not on file  Transportation Needs: Not on file  Physical Activity: Not on file  Stress: Not on file  Social Connections: Not on file    Allergies:   Allergies  Allergen Reactions   Other Anaphylaxis    PEANUTS   Gabapentin Swelling and Other (See Comments)    Face swelling   Haloperidol Lactate Other (See Comments)    Muscle stiffness & tightness   Ketorolac Swelling   Olanzapine  Other (See Comments)   Peanut-Containing Drug Products Swelling and Other (See Comments)   Tramadol Other (See Comments)    dizzy    Labs:  Results for orders placed or performed during the hospital encounter of 03/01/23 (from the past 48 hour(s))  Urine rapid drug screen (hosp performed)     Status: Abnormal   Collection Time: 03/01/23  5:24 PM  Result Value Ref Range   Opiates NONE DETECTED NONE DETECTED   Cocaine POSITIVE (A) NONE DETECTED   Benzodiazepines NONE DETECTED NONE DETECTED   Amphetamines NONE DETECTED NONE DETECTED   Tetrahydrocannabinol POSITIVE (A) NONE DETECTED   Barbiturates NONE DETECTED NONE DETECTED    Comment: (NOTE) DRUG SCREEN FOR MEDICAL PURPOSES ONLY.  IF CONFIRMATION IS NEEDED FOR ANY PURPOSE, NOTIFY LAB WITHIN 5 DAYS.  LOWEST DETECTABLE LIMITS FOR URINE DRUG SCREEN Drug Class                     Cutoff (ng/mL) Amphetamine and metabolites    1000 Barbiturate and metabolites    200 Benzodiazepine                 200 Opiates and metabolites        300 Cocaine and metabolites        300 THC                            50 Performed at [facility], [address], [place], [place] [address]   POC CBG, ED     Status: Abnormal   Collection Time: 03/01/23  6:25 PM  Result Value Ref Range   Glucose-Capillary 106 (H) 70 - 99 mg/dL    Comment: Glucose reference range applies only to samples taken after fasting for at least 8 hours.  CBC     Status: None   Collection Time: 03/01/23  6:27 PM  Result Value Ref Range   WBC 7.3 4.0 - 10.5 K/uL   RBC 5.06 4.22 - 5.81 MIL/uL   Hemoglobin 14.9 13.0 - 17.0 g/dL   HCT 60.4 54.0 - 98.1 %   MCV 90.9 80.0 - 100.0 fL   MCH 29.4 26.0 - 34.0 pg   MCHC 32.4 30.0 - 36.0 g/dL   RDW 19.1 47.8 - 29.5 %   Platelets 223 150 - 400 K/uL   nRBC 0.0 0.0 - 0.2 %    Comment: Performed at [facility], [address], [place], [place] [address]  Basic metabolic panel     Status: Abnormal    Collection Time: 03/01/23  6:27 PM  Result Value Ref Range   Sodium 138 135 - 145 mmol/L  Potassium 3.3 (L) 3.5 - 5.1 mmol/L   Chloride 106 98 - 111 mmol/L   CO2 22 22 - 32 mmol/L   Glucose, Bld 118 (H) 70 - 99 mg/dL    Comment: Glucose reference range applies only to samples taken after fasting for at least 8 hours.   BUN 33 (H) 6 - 20 mg/dL   Creatinine, Ser 4.69 0.61 - 1.24 mg/dL   Calcium 8.9 8.9 - 62.9 mg/dL   GFR, Estimated >52 >84 mL/min    Comment: (NOTE) Calculated using the CKD-EPI Creatinine Equation (2021)    Anion gap 10 5 - 15    Comment: Performed at [facility], [address], [place], [place] [address]  Ethanol     Status: None   Collection Time: 03/01/23  6:27 PM  Result Value Ref Range   Alcohol, Ethyl (B) <10 <10 mg/dL    Comment: (NOTE) Lowest detectable limit for serum alcohol is 10 mg/dL.  For medical purposes only. Performed at [facility], [address], [place], [place] [address]     Current Facility-Administered Medications  Medication Dose Route Frequency Provider Last Rate Last Admin   darunavir (PREZISTA) tablet 800 mg  800 mg Oral Q breakfast Rexford Maus, DO       elvitegravir-cobicistat-emtricitabine-tenofovir (GENVOYA) 150-150-200-10 MG tablet 1 tablet  1 tablet Oral Q breakfast Rexford Maus, DO   1 tablet at 03/02/23 2536   Current Outpatient Medications  Medication Sig Dispense Refill   PREZISTA 800 MG tablet Take 1 tablet by mouth daily.     busPIRone (BUSPAR) 15 MG tablet Take 15 mg by mouth 2 (two) times daily.     Darunavir Ethanolate (PREZISTA) 800 MG tablet Take 800 mg by mouth daily.     diazepam (VALIUM) 5 MG tablet Take 5-10 mg by mouth every 6 (six) hours as needed for anxiety.     divalproex (DEPAKOTE) 500 MG DR tablet Take 500 mg by mouth 2 (two) times daily.     elvitegravir-cobicistat-emtricitabine-tenofovir (GENVOYA) 150-150-200-10 MG TABS tablet Take 1 tablet by  mouth daily with breakfast.     imiquimod (ALDARA) 5 % cream Apply 1 Application topically at bedtime.     QUEtiapine (SEROQUEL) 50 MG tablet 50 mg each morning, 100 mg each night 90 tablet 0    Musculoskeletal:  Patient observed resting in bed.   Psychiatric Specialty Exam: Presentation  General Appearance:  Appropriate for Environment; Bizarre  Eye Contact: Minimal  Speech: Garbled  Speech Volume: Decreased  Handedness:No data recorded  Mood and Affect  Mood: Irritable  Affect: Blunt; Flat   Thought Process  Thought Processes: Disorganized  Descriptions of Associations:Loose  Orientation:Partial  Thought Content:Scattered  History of Schizophrenia/Schizoaffective disorder:No data recorded Duration of Psychotic Symptoms:No data recorded Hallucinations:Hallucinations: None  Ideas of Reference:None  Suicidal Thoughts:Suicidal Thoughts: No  Homicidal Thoughts:Homicidal Thoughts: No   Sensorium  Memory: Immediate Poor; Remote Poor  Judgment: Impaired  Insight: Lacking   Executive Functions  Concentration: Poor  Attention Span: Poor  Recall: [employer] of Knowledge: Fair  Language: Fair   Psychomotor Activity  Psychomotor Activity: Psychomotor Activity: Normal   Assets  Assets: Communication Skills    Sleep  Sleep: Sleep: Fair   Physical Exam: Physical Exam Vitals and nursing note reviewed. Exam conducted with a chaperone present.  Neurological:     Mental Status: He is alert.  Psychiatric:        Attention and Perception: Attention normal.  Mood and Affect: Mood is anxious. Affect is flat.        Speech: Speech is rapid and pressured and tangential.        Behavior: Behavior is agitated.        Thought Content: Thought content is delusional.        Judgment: Judgment is inappropriate.    Review of Systems  Constitutional: Negative.   Respiratory: Negative.    Psychiatric/Behavioral:  Positive for  substance abuse.    Blood pressure 124/81, pulse 67, temperature 98.8 F (37.1 C), temperature source Oral, resp. rate 18, weight 71 kg, SpO2 98 %. Body mass index is 25.26 kg/m.   Medical Decision Making: Patient case review and discussed with Dr. Lucianne Muss. Patient needs inpatient psychiatric admission for stabilization and treatment.  Will restart patient's home medications. - please  Obtain Lipid panel, Prolactin, HgbA1C, and TSH  EKG Qtc 434 on 03/01/23  Disposition: Recommend psychiatric Inpatient admission when medically cleared.  Alona Bene, PMHNP 03/02/2023 6:09 PM

## 2023-03-02 NOTE — ED Notes (Signed)
Patient appears to be responding to internal stimuli at this time. Patient staring off into a corner in the room nodding his head and occasionally laughing.

## 2023-03-02 NOTE — ED Notes (Addendum)
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 Upon observation, foot appears congruent in appearance with his other foot and within normal limits.

## 2023-03-02 NOTE — ED Notes (Signed)
Provided pt with burgundy scrubs and instructed him to change into them.

## 2023-03-03 DIAGNOSIS — R531 Weakness: Secondary | ICD-10-CM | POA: Diagnosis not present

## 2023-03-03 MED ORDER — ACETAMINOPHEN 325 MG PO TABS
650.0000 mg | ORAL_TABLET | Freq: Four times a day (QID) | ORAL | Status: DC | PRN
  Administered 2023-03-03 (×2): 650 mg via ORAL
  Filled 2023-03-03 (×2): qty 2

## 2023-03-03 MED ORDER — QUETIAPINE FUMARATE 50 MG PO TABS
50.0000 mg | ORAL_TABLET | Freq: Every day | ORAL | Status: DC
  Administered 2023-03-04: 50 mg via ORAL
  Filled 2023-03-03: qty 1

## 2023-03-03 MED ORDER — IBUPROFEN 200 MG PO TABS
400.0000 mg | ORAL_TABLET | Freq: Once | ORAL | Status: AC
  Administered 2023-03-03: 400 mg via ORAL
  Filled 2023-03-03: qty 2

## 2023-03-03 NOTE — ED Provider Notes (Signed)
Emergency Medicine Observation Re-evaluation Note  Brian Christian is a 42 y.o. male, seen on rounds today.  Pt initially presented to the ED for complaints of Weakness Currently, the patient is sitting in the bed watching TV.  Physical Exam  BP 112/67 (BP Location: Right Arm)   Pulse 70   Temp 98.4 F (36.9 C) (Oral)   Resp 18   Wt 71 kg   SpO2 100%   BMI 25.26 kg/m  Physical Exam General: calm and cooperative Cardiac: regular rate Lungs: clear Psych: calm and cooperative  ED Course / MDM  EKG:EKG Interpretation  Date/Time:  Tuesday March 01 2023 21:47:20 EDT Ventricular Rate:  71 PR Interval:  135 QRS Duration: 94 QT Interval:  399 QTC Calculation: 434 R Axis:   15 Text Interpretation: Sinus rhythm No significant change since last tracing Confirmed by Elayne Snare ([id number]) on 03/01/2023 11:44:27 PM  I have reviewed the labs performed to date as well as medications administered while in observation.  Recent changes in the last 24 hours include none.  Plan  Current plan is for inpt amission.    Gwyneth Sprout, MD 03/03/23 [phone]

## 2023-03-03 NOTE — Progress Notes (Signed)
[facility] Psych ED Progress Note  03/03/2023 3:04 PM Brian Christian  MRN:  [id number]   Principal Problem: <principal problem not specified> Diagnosis:  Active Problems:   Psychosis   ED Assessment Time Calculation: Start Time: 1100 Stop Time: 1115 Total Time in Minutes (Assessment Completion): 15    Subjective: On evaluation today, the patient is laying in his bed, in no acute address. He is calm and cooperative during this assessment. His appearance is appropriate for environment. His eye contact is good. Speech is clear and coherent, normal pace and normal volume. He reports his mood as "doing great".  Affect is congruent with mood. Patient is in a better mood, than the precious day, affect appears much brighter. Thought process is coherent and disorganized. Thought content is slightly tangential. He denies auditory and visual hallucinations.  No indication that he is responding to internal stimuli during this assessment. He denies suicidal ideations. He denies homicidal ideations.  During the end of evaluation, patient begin to say he was ready to go home, when provider asked him if he was from [place], he said no him from [place], "I live in the [facility], I am rich I am a billionaire ".  Then patient began talking about Garnet Koyanagi, how he was dead and corrupt.  Patient continues to be very delusional per chart review patient has been very irritable throughout the day, and demanding, demanding the nurse tech to clear his bedside table if not she will be fired. Support, encouragement and reassurance provided about ongoing stressors and patient provided with opportunity for questions.     Past Psychiatric History:  schizophrenia  G[place] Scale:  Flowsheet Row ED from 03/01/2023 in [facility] Emergency Department at [facility]  C-SSRS RISK CATEGORY No Risk       Past Medical History:  Past Medical History:  Diagnosis Date   HIV (human immunodeficiency  virus infection)    Stroke    History reviewed. No pertinent surgical history. Family History: History reviewed. No pertinent family history.   Social History:  Social History   Substance and Sexual Activity  Alcohol Use Yes   Comment: ocassionally     Social History   Substance and Sexual Activity  Drug Use Yes   Types: Marijuana    Social History   Socioeconomic History   Marital status: Single    Spouse name: Not on file   Number of children: Not on file   Years of education: Not on file   Highest education level: Not on file  Occupational History   Not on file  Tobacco Use   Smoking status: Every Day    Packs/day: .5    Types: Cigarettes   Smokeless tobacco: Never  Substance and Sexual Activity   Alcohol use: Yes    Comment: ocassionally   Drug use: Yes    Types: Marijuana   Sexual activity: Not on file  Other Topics Concern   Not on file  Social History Narrative   Not on file   Social Determinants of Health   Financial Resource Strain: Not on file  Food Insecurity: Not on file  Transportation Needs: Not on file  Physical Activity: Not on file  Stress: Not on file  Social Connections: Not on file    Sleep: Good  Appetite:  Good  Current Medications: Current Facility-Administered Medications  Medication Dose Route Frequency Provider Last Rate Last Admin   acetaminophen (TYLENOL) tablet 650 mg  650 mg Oral Q6H PRN  Dione Booze, MD   650 mg at 03/03/23 0006   busPIRone (BUSPAR) tablet 15 mg  15 mg Oral BID Motley-Mangrum, Arihanna Estabrook A, PMHNP   15 mg at 03/03/23 0923   darunavir (PREZISTA) tablet 800 mg  800 mg Oral Q breakfast Elayne Snare K, DO   800 mg at 03/03/23 4098   divalproex (DEPAKOTE) DR tablet 500 mg  500 mg Oral BID Motley-Mangrum, Thad Osoria A, PMHNP   500 mg at 03/03/23 1191   elvitegravir-cobicistat-emtricitabine-tenofovir (GENVOYA) 150-150-200-10 MG tablet 1 tablet  1 tablet Oral Q breakfast Elayne Snare K, DO   1 tablet at  03/03/23 4782   QUEtiapine (SEROQUEL) tablet 100 mg  100 mg Oral QHS Motley-Mangrum, Albert Devaul A, PMHNP   100 mg at 03/02/23 2130   [START ON 03/04/2023] QUEtiapine (SEROQUEL) tablet 50 mg  50 mg Oral Q1400 Motley-Mangrum, Loria Lacina A, PMHNP       Current Outpatient Medications  Medication Sig Dispense Refill   busPIRone (BUSPAR) 15 MG tablet Take 30 mg by mouth 2 (two) times daily.     Darunavir Ethanolate (PREZISTA) 800 MG tablet Take 800 mg by mouth daily.     divalproex (DEPAKOTE) 500 MG DR tablet Take 500 mg by mouth 2 (two) times daily.     elvitegravir-cobicistat-emtricitabine-tenofovir (GENVOYA) 150-150-200-10 MG TABS tablet Take 1 tablet by mouth daily with breakfast.     imiquimod (ALDARA) 5 % cream Apply 1 Application topically at bedtime.     QUEtiapine (SEROQUEL) 50 MG tablet 50 mg each morning, 100 mg each night (Patient taking differently: Take 50 mg by mouth 2 (two) times daily.) 90 tablet 0   PREZISTA 800 MG tablet Take 1 tablet by mouth daily. (Patient not taking: Reported on 03/02/2023)      Lab Results:  Results for orders placed or performed during the hospital encounter of 03/01/23 (from the past 48 hour(s))  Urine rapid drug screen (hosp performed)     Status: Abnormal   Collection Time: 03/01/23  5:24 PM  Result Value Ref Range   Opiates NONE DETECTED NONE DETECTED   Cocaine POSITIVE (A) NONE DETECTED   Benzodiazepines NONE DETECTED NONE DETECTED   Amphetamines NONE DETECTED NONE DETECTED   Tetrahydrocannabinol POSITIVE (A) NONE DETECTED   Barbiturates NONE DETECTED NONE DETECTED    Comment: (NOTE) DRUG SCREEN FOR MEDICAL PURPOSES ONLY.  IF CONFIRMATION IS NEEDED FOR ANY PURPOSE, NOTIFY LAB WITHIN 5 DAYS.  LOWEST DETECTABLE LIMITS FOR URINE DRUG SCREEN Drug Class                     Cutoff (ng/mL) Amphetamine and metabolites    1000 Barbiturate and metabolites    200 Benzodiazepine                 200 Opiates and metabolites        300 Cocaine and metabolites         300 THC                            50 Performed at [facility], [address], [place], [place] [address]   POC CBG, ED     Status: Abnormal   Collection Time: 03/01/23  6:25 PM  Result Value Ref Range   Glucose-Capillary 106 (H) 70 - 99 mg/dL    Comment: Glucose reference range applies only to samples taken after fasting for at least 8 hours.  CBC  Status: None   Collection Time: 03/01/23  6:27 PM  Result Value Ref Range   WBC 7.3 4.0 - 10.5 K/uL   RBC 5.06 4.22 - 5.81 MIL/uL   Hemoglobin 14.9 13.0 - 17.0 g/dL   HCT 16.1 09.6 - 04.5 %   MCV 90.9 80.0 - 100.0 fL   MCH 29.4 26.0 - 34.0 pg   MCHC 32.4 30.0 - 36.0 g/dL   RDW 40.9 81.1 - 91.4 %   Platelets 223 150 - 400 K/uL   nRBC 0.0 0.0 - 0.2 %    Comment: Performed at [facility], [address] [place], [place] [address]  Basic metabolic panel     Status: Abnormal   Collection Time: 03/01/23  6:27 PM  Result Value Ref Range   Sodium 138 135 - 145 mmol/L   Potassium 3.3 (L) 3.5 - 5.1 mmol/L   Chloride 106 98 - 111 mmol/L   CO2 22 22 - 32 mmol/L   Glucose, Bld 118 (H) 70 - 99 mg/dL    Comment: Glucose reference range applies only to samples taken after fasting for at least 8 hours.   BUN 33 (H) 6 - 20 mg/dL   Creatinine, Ser 6.21 0.61 - 1.24 mg/dL   Calcium 8.9 8.9 - 30.8 mg/dL   GFR, Estimated >65 >78 mL/min    Comment: (NOTE) Calculated using the CKD-EPI Creatinine Equation (2021)    Anion gap 10 5 - 15    Comment: Performed at [facility], [address], [place], [place] [address]  Ethanol     Status: None   Collection Time: 03/01/23  6:27 PM  Result Value Ref Range   Alcohol, Ethyl (B) <10 <10 mg/dL    Comment: (NOTE) Lowest detectable limit for serum alcohol is 10 mg/dL.  For medical purposes only. Performed at [facility], [address], [place], [place] [address]   SARS Coronavirus 2 by RT PCR (hospital order,  performed in [facility] hospital lab) *cepheid single result test* Anterior Nasal Swab     Status: None   Collection Time: 03/02/23  6:31 PM   Specimen: Anterior Nasal Swab  Result Value Ref Range   SARS Coronavirus 2 by RT PCR NEGATIVE NEGATIVE    Comment: (NOTE) SARS-CoV-2 target nucleic acids are NOT DETECTED.  The SARS-CoV-2 RNA is generally detectable in upper and lower respiratory specimens during the acute phase of infection. The lowest concentration of SARS-CoV-2 viral copies this assay can detect is 250 copies / mL. A negative result does not preclude SARS-CoV-2 infection and should not be used as the sole basis for treatment or other patient management decisions.  A negative result may occur with improper specimen collection / handling, submission of specimen other than nasopharyngeal swab, presence of viral mutation(s) within the areas targeted by this assay, and inadequate number of viral copies (<250 copies / mL). A negative result must be combined with clinical observations, patient history, and epidemiological information.  Fact Sheet for Patients:   RoadLapTop.co.za  Fact Sheet for Healthcare Providers: http://kim-miller.com/  This test is not yet approved or  cleared by the [place] FDA and has been authorized for detection and/or diagnosis of SARS-CoV-2 by FDA under an Emergency Use Authorization (EUA).  This EUA will remain in effect (meaning this test can be used) for the duration of the COVID-19 declaration under Section 564(b)(1) of the Act, 21 U.S.C. section 360bbb-3(b)(1), unless the authorization is terminated or revoked sooner.  Performed at [facility], [address], [place], [place] [address]     Blood Alcohol level:  Lab Results  Component Value Date   [facility] <10 03/01/2023   ETH <5 08/12/2016    Physical Findings:  CIWA:    COWS:     Musculoskeletal:  Patient observed  resting in bed.   Psychiatric Specialty Exam:  Presentation  General Appearance:  Bizarre  Eye Contact: Minimal  Speech: Clear and Coherent  Speech Volume: Increased  Handedness:No data recorded  Mood and Affect  Mood: Euthymic  Affect: Flat   Thought Process  Thought Processes: Disorganized  Descriptions of Associations:Loose  Orientation:Partial  Thought Content:Delusions  History of Schizophrenia/Schizoaffective disorder:Yes  Duration of Psychotic Symptoms:Greater than six months  Hallucinations:Hallucinations: None  Ideas of Reference:None  Suicidal Thoughts:Suicidal Thoughts: No  Homicidal Thoughts:Homicidal Thoughts: No   Sensorium  Memory: Immediate Fair; Remote Fair  Judgment: Impaired  Insight: Lacking   Executive Functions  Concentration: Poor  Attention Span: Fair  Recall: [employer] of Knowledge: Fair  Language: Fair   Psychomotor Activity  Psychomotor Activity: Psychomotor Activity: Normal   Assets  Assets: Communication Skills   Sleep  Sleep: Sleep: Fair    Physical Exam: Physical Exam Vitals and nursing note reviewed. Exam conducted with a chaperone present.  Neurological:     Mental Status: He is alert.  Psychiatric:        Attention and Perception: Attention normal.        Mood and Affect: Mood normal.        Speech: Speech normal.        Behavior: Behavior is cooperative.        Thought Content: Thought content is delusional.        Cognition and Memory: Cognition is impaired.        Judgment: Judgment is inappropriate.    Review of Systems  Constitutional: Negative.   HENT: Negative.    Respiratory: Negative.    Musculoskeletal: Negative.   Psychiatric/Behavioral:         Patient continues to be delusional   Blood pressure 112/67, pulse 70, temperature 98.4 F (36.9 C), temperature source Oral, resp. rate 18, weight 71 kg, SpO2 100 %. Body mass index is 25.26 kg/m.    Medical  Decision Making: Patient continues to require inpatient Psychiatric hospitalization.  Per TTS social work there are no available beds at [facility], patient has been faxed out to several other facilities.    Daffney Greenly MOTLEY-MANGRUM, PMHNP 03/03/2023, 3:04 PM

## 2023-03-03 NOTE — Progress Notes (Signed)
LCSW Progress Note  [id number]   Bryor Rami  03/03/2023  2:08 PM  Description:   Inpatient Psychiatric Referral  Patient was recommended inpatient per [facility], PMHNP. There are no available beds at [facility]. Patient was referred to the following facilities:   Destination  Service Provider Address Phone Fax  [facility]  [address] [place] [address] [phone]  [facility]  [address] [place] [address] [phone]  [facility]  [address], [place] [address] [phone]  [facility]  [address] [place] [address] [phone]  [facility]  [address] [place] [address] [phone]  [facility]  [address] [place], [place] [address] [phone]  [facility]  [address] [place], [place] [address] [phone]  [facility]  [address] [place] [address] [phone]  [facility]  [address], [place] [address] [phone]  [facility]  [address], [place] [address] [phone]  [facility]  [address] [place] [address] [phone]  [facility] [address]  [address] [place] [address] [phone]  [facility]  [address] [place], [place] [address] [phone]  [facility] [address] [place]  [address] [place] [address] [phone]  [facility]  [address] [place] [address] [phone]  [facility]  [address] [place], [place] [address] [phone]   [facility]  [address] [place] [address] [phone]  [facility]  [address] [place], [place] [address] [phone]  [facility]  [address] [place] [address] [phone]  [facility]  [address], [place] [address] [phone]  [facility]  [address] [place] [address] [phone]  [facility]  [address], [place] [address] [phone]  [facility]  [address] [place] [address] [phone]    Situation ongoing, CSW to continue following and update chart as more information becomes available.      Cathie Beams, [place]  03/03/2023 2:08 PM

## 2023-03-04 ENCOUNTER — Encounter (HOSPITAL_COMMUNITY): Payer: Self-pay | Admitting: Psychiatry

## 2023-03-04 ENCOUNTER — Inpatient Hospital Stay (HOSPITAL_COMMUNITY)
Admission: AD | Admit: 2023-03-04 | Discharge: 2023-03-07 | DRG: 885 | Disposition: A | Discharge status: Home / Self Care | Payer: Medicare HMO | Source: Intra-hospital | Attending: Psychiatry | Admitting: Psychiatry

## 2023-03-04 ENCOUNTER — Other Ambulatory Visit: Payer: Self-pay

## 2023-03-04 DIAGNOSIS — Z8673 Personal history of transient ischemic attack (TIA), and cerebral infarction without residual deficits: Secondary | ICD-10-CM

## 2023-03-04 DIAGNOSIS — F1410 Cocaine abuse, uncomplicated: Secondary | ICD-10-CM | POA: Diagnosis present

## 2023-03-04 DIAGNOSIS — Z79899 Other long term (current) drug therapy: Secondary | ICD-10-CM

## 2023-03-04 DIAGNOSIS — Z885 Allergy status to narcotic agent status: Secondary | ICD-10-CM

## 2023-03-04 DIAGNOSIS — G4700 Insomnia, unspecified: Secondary | ICD-10-CM | POA: Diagnosis present

## 2023-03-04 DIAGNOSIS — K219 Gastro-esophageal reflux disease without esophagitis: Secondary | ICD-10-CM | POA: Diagnosis present

## 2023-03-04 DIAGNOSIS — E785 Hyperlipidemia, unspecified: Secondary | ICD-10-CM | POA: Diagnosis present

## 2023-03-04 DIAGNOSIS — F203 Undifferentiated schizophrenia: Secondary | ICD-10-CM | POA: Diagnosis present

## 2023-03-04 DIAGNOSIS — I10 Essential (primary) hypertension: Secondary | ICD-10-CM | POA: Diagnosis present

## 2023-03-04 DIAGNOSIS — F209 Schizophrenia, unspecified: Secondary | ICD-10-CM

## 2023-03-04 DIAGNOSIS — F29 Unspecified psychosis not due to a substance or known physiological condition: Secondary | ICD-10-CM | POA: Diagnosis not present

## 2023-03-04 DIAGNOSIS — F1210 Cannabis abuse, uncomplicated: Secondary | ICD-10-CM | POA: Diagnosis present

## 2023-03-04 DIAGNOSIS — Z21 Asymptomatic human immunodeficiency virus [HIV] infection status: Secondary | ICD-10-CM | POA: Diagnosis present

## 2023-03-04 DIAGNOSIS — F22 Delusional disorders: Secondary | ICD-10-CM | POA: Diagnosis not present

## 2023-03-04 DIAGNOSIS — F17210 Nicotine dependence, cigarettes, uncomplicated: Secondary | ICD-10-CM | POA: Diagnosis present

## 2023-03-04 DIAGNOSIS — Z91010 Allergy to peanuts: Secondary | ICD-10-CM | POA: Diagnosis not present

## 2023-03-04 DIAGNOSIS — F419 Anxiety disorder, unspecified: Secondary | ICD-10-CM | POA: Diagnosis present

## 2023-03-04 DIAGNOSIS — Z888 Allergy status to other drugs, medicaments and biological substances status: Secondary | ICD-10-CM | POA: Diagnosis not present

## 2023-03-04 DIAGNOSIS — Z79890 Hormone replacement therapy (postmenopausal): Secondary | ICD-10-CM | POA: Diagnosis not present

## 2023-03-04 DIAGNOSIS — R531 Weakness: Secondary | ICD-10-CM | POA: Diagnosis not present

## 2023-03-04 MED ORDER — ALUM & MAG HYDROXIDE-SIMETH 200-200-20 MG/5ML PO SUSP: 30.0000 mL | ORAL | Status: DC | PRN

## 2023-03-04 MED ORDER — HYDROXYZINE HCL 25 MG PO TABS
25.0000 mg | ORAL_TABLET | Freq: Three times a day (TID) | ORAL | Status: DC | PRN
  Administered 2023-03-04 – 2023-03-06 (×5): 25 mg via ORAL
  Filled 2023-03-04 (×5): qty 1

## 2023-03-04 MED ORDER — DIVALPROEX SODIUM 500 MG PO DR TAB
500.0000 mg | DELAYED_RELEASE_TABLET | Freq: Two times a day (BID) | ORAL | Status: DC
  Administered 2023-03-04 – 2023-03-06 (×4): 500 mg via ORAL
  Filled 2023-03-04 (×11): qty 1

## 2023-03-04 MED ORDER — QUETIAPINE FUMARATE 50 MG PO TABS
50.0000 mg | ORAL_TABLET | Freq: Every day | ORAL | Status: DC
  Filled 2023-03-04 (×2): qty 1

## 2023-03-04 MED ORDER — DIPHENHYDRAMINE HCL 25 MG PO CAPS
50.0000 mg | ORAL_CAPSULE | Freq: Three times a day (TID) | ORAL | Status: DC | PRN
  Administered 2023-03-05 (×2): 50 mg via ORAL
  Filled 2023-03-04 (×2): qty 2

## 2023-03-04 MED ORDER — DARUNAVIR 800 MG PO TABS
800.0000 mg | ORAL_TABLET | Freq: Every day | ORAL | Status: DC
  Administered 2023-03-05 – 2023-03-07 (×3): 800 mg via ORAL
  Filled 2023-03-04 (×5): qty 1

## 2023-03-04 MED ORDER — TRAZODONE HCL 50 MG PO TABS
50.0000 mg | ORAL_TABLET | Freq: Every evening | ORAL | Status: DC | PRN
  Administered 2023-03-05 – 2023-03-06 (×2): 50 mg via ORAL
  Filled 2023-03-04 (×2): qty 1

## 2023-03-04 MED ORDER — QUETIAPINE FUMARATE 100 MG PO TABS
100.0000 mg | ORAL_TABLET | Freq: Every day | ORAL | Status: DC
  Administered 2023-03-04: 100 mg via ORAL
  Filled 2023-03-04 (×4): qty 1

## 2023-03-04 MED ORDER — ELVITEG-COBIC-EMTRICIT-TENOFAF 150-150-200-10 MG PO TABS
1.0000 | ORAL_TABLET | Freq: Every day | ORAL | Status: DC
  Administered 2023-03-05 – 2023-03-06 (×2): 1 via ORAL
  Filled 2023-03-04 (×5): qty 1

## 2023-03-04 MED ORDER — DIPHENHYDRAMINE HCL 50 MG/ML IJ SOLN: 50.0000 mg | Freq: Three times a day (TID) | INTRAMUSCULAR | Status: DC | PRN

## 2023-03-04 MED ORDER — BUSPIRONE HCL 15 MG PO TABS
15.0000 mg | ORAL_TABLET | Freq: Two times a day (BID) | ORAL | Status: DC
  Administered 2023-03-04 – 2023-03-06 (×5): 15 mg via ORAL
  Filled 2023-03-04 (×11): qty 1

## 2023-03-04 MED ORDER — ACETAMINOPHEN 325 MG PO TABS
650.0000 mg | ORAL_TABLET | Freq: Four times a day (QID) | ORAL | Status: DC | PRN
  Administered 2023-03-05: 650 mg via ORAL
  Filled 2023-03-04: qty 2

## 2023-03-04 MED ORDER — MAGNESIUM HYDROXIDE 400 MG/5ML PO SUSP: 30.0000 mL | Freq: Every day | ORAL | Status: DC | PRN

## 2023-03-04 MED ORDER — LORAZEPAM 1 MG PO TABS
2.0000 mg | ORAL_TABLET | Freq: Three times a day (TID) | ORAL | Status: DC | PRN
  Administered 2023-03-05 – 2023-03-07 (×3): 2 mg via ORAL
  Filled 2023-03-04 (×3): qty 2

## 2023-03-04 MED ORDER — LORAZEPAM 2 MG/ML IJ SOLN: 2.0000 mg | Freq: Three times a day (TID) | INTRAMUSCULAR | Status: DC | PRN

## 2023-03-04 NOTE — Progress Notes (Signed)
Pt was accepted to [facility] TODAY 03/04/2023. Bed assignment: 508-2  Pt meets inpatient criteria per Alona Bene, PMHNP  Attending Physician will be Phineas Inches, MD  Report can be called to: - Adult unit: [phone]  Pt can arrive after 4 PM  Care Team Notified: [facility], RN, Lum Babe, RN, and [facility], PMHNP  [place], [place]  03/04/2023 1:57 PM

## 2023-03-04 NOTE — ED Notes (Signed)
Patient discharged off unit to [facility] per provider. Patient alert. Patient upset about going to [facility] but was cooperative. Discharge information and belongings given to [employer] for transport. Patient ambulatory off unit, escorted and transported by [employer].

## 2023-03-04 NOTE — ED Notes (Signed)
Breakfast tray delivered

## 2023-03-04 NOTE — ED Notes (Signed)
Patient is alert . Patient cooperative, Patient medication compliant. Patient quiet and guarded. No suicidal ideation noted. No homicidal ideation noted.  Patient can do all ADLs.
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
## 2023-03-04 NOTE — ED Notes (Signed)
Pt verbally abusive to staff when refuse more food pt was reminded that staff have given him food on several occasions this shift. He was also made aware that it is very inappropriate to harass staff for food.

## 2023-03-04 NOTE — ED Notes (Signed)
Sleeping awakens only to request food and became angry when informed of limits to the amount of food he can have.

## 2023-03-04 NOTE — Progress Notes (Signed)
[facility] ED Progress Note  03/04/2023 2:40 PM Brian Christian  MRN:  [id number]   Subjective:   Principal Problem: Psychosis Diagnosis:  Principal Problem:   Psychosis   ED Assessment Time Calculation: Start Time: 1045 Stop Time: 1100 Total Time in Minutes (Assessment Completion): 15  Subjective:  On evaluation today, the patient is laying in his bed, in no acute address. He is calm and cooperative during this assessment. He is just waking up for the morning. His appearance is appropriate for environment. His eye contact is good. Speech is clear and coherent, normal pace and normal volume. He reports his mood as "good".  Affect is congruent with mood. Thought process is coherent and disorganized. Thought content is slightly tangential. He denies auditory and visual hallucinations.  No indication that he is responding to internal stimuli during this assessment. He denies suicidal ideations. He denies homicidal ideations.  Patient medication compliant. Patient quiet and guarded. Support, encouragement and reassurance provided about ongoing stressors and patient provided with opportunity for questions.     Past Psychiatric History:   schizophrenia    [place] Scale:  Flowsheet Row ED from 03/01/2023 in [facility] Emergency Department at [facility]  C-SSRS RISK CATEGORY No Risk       Past Medical History:  Past Medical History:  Diagnosis Date   HIV (human immunodeficiency virus infection)    Stroke    History reviewed. No pertinent surgical history. Family History: History reviewed. No pertinent family history.  Social History:  Social History   Substance and Sexual Activity  Alcohol Use Yes   Comment: ocassionally     Social History   Substance and Sexual Activity  Drug Use Yes   Types: Marijuana    Social History   Socioeconomic History   Marital status: Single    Spouse name: Not on file   Number of children: Not on file   Years of education: Not on file    Highest education level: Not on file  Occupational History   Not on file  Tobacco Use   Smoking status: Every Day    Packs/day: .5    Types: Cigarettes   Smokeless tobacco: Never  Substance and Sexual Activity   Alcohol use: Yes    Comment: ocassionally   Drug use: Yes    Types: Marijuana   Sexual activity: Not on file  Other Topics Concern   Not on file  Social History Narrative   Not on file   Social Determinants of Health   Financial Resource Strain: Not on file  Food Insecurity: Not on file  Transportation Needs: Not on file  Physical Activity: Not on file  Stress: Not on file  Social Connections: Not on file    Sleep: Fair  Appetite:  Fair  Current Medications: Current Facility-Administered Medications  Medication Dose Route Frequency Provider Last Rate Last Admin   acetaminophen (TYLENOL) tablet 650 mg  650 mg Oral Q6H PRN Dione Booze, MD   650 mg at 03/03/23 1759   busPIRone (BUSPAR) tablet 15 mg  15 mg Oral BID Motley-Mangrum, Caidance Sybert A, PMHNP   15 mg at 03/04/23 1026   darunavir (PREZISTA) tablet 800 mg  800 mg Oral Q breakfast Kingsley, Victoria K, DO   800 mg at 03/04/23 1025   divalproex (DEPAKOTE) DR tablet 500 mg  500 mg Oral BID Motley-Mangrum, Aamori Mcmasters A, PMHNP   500 mg at 03/04/23 1025   elvitegravir-cobicistat-emtricitabine-tenofovir (GENVOYA) 150-150-200-10 MG tablet 1 tablet  1 tablet Oral  Q breakfast Elayne Snare K, DO   1 tablet at 03/04/23 1025   QUEtiapine (SEROQUEL) tablet 100 mg  100 mg Oral QHS Motley-Mangrum, Christion Leonhard A, PMHNP   100 mg at 03/03/23 2247   QUEtiapine (SEROQUEL) tablet 50 mg  50 mg Oral Q1400 Motley-Mangrum, Tranisha Tissue A, PMHNP   50 mg at 03/04/23 1025   Current Outpatient Medications  Medication Sig Dispense Refill   busPIRone (BUSPAR) 15 MG tablet Take 30 mg by mouth 2 (two) times daily.     Darunavir Ethanolate (PREZISTA) 800 MG tablet Take 800 mg by mouth daily.     divalproex (DEPAKOTE) 500 MG DR tablet Take 500 mg by  mouth 2 (two) times daily.     elvitegravir-cobicistat-emtricitabine-tenofovir (GENVOYA) 150-150-200-10 MG TABS tablet Take 1 tablet by mouth daily with breakfast.     imiquimod (ALDARA) 5 % cream Apply 1 Application topically at bedtime.     QUEtiapine (SEROQUEL) 50 MG tablet 50 mg each morning, 100 mg each night (Patient taking differently: Take 50 mg by mouth 2 (two) times daily.) 90 tablet 0   PREZISTA 800 MG tablet Take 1 tablet by mouth daily. (Patient not taking: Reported on 03/02/2023)      Lab Results:  Results for orders placed or performed during the hospital encounter of 03/01/23 (from the past 48 hour(s))  SARS Coronavirus 2 by RT PCR (hospital order, performed in [facility] hospital lab) *cepheid single result test* Anterior Nasal Swab     Status: None   Collection Time: 03/02/23  6:31 PM   Specimen: Anterior Nasal Swab  Result Value Ref Range   SARS Coronavirus 2 by RT PCR NEGATIVE NEGATIVE    Comment: (NOTE) SARS-CoV-2 target nucleic acids are NOT DETECTED.  The SARS-CoV-2 RNA is generally detectable in upper and lower respiratory specimens during the acute phase of infection. The lowest concentration of SARS-CoV-2 viral copies this assay can detect is 250 copies / mL. A negative result does not preclude SARS-CoV-2 infection and should not be used as the sole basis for treatment or other patient management decisions.  A negative result may occur with improper specimen collection / handling, submission of specimen other than nasopharyngeal swab, presence of viral mutation(s) within the areas targeted by this assay, and inadequate number of viral copies (<250 copies / mL). A negative result must be combined with clinical observations, patient history, and epidemiological information.  Fact Sheet for Patients:   RoadLapTop.co.za  Fact Sheet for Healthcare Providers: http://kim-miller.com/  This test is not yet approved or   cleared by the [place] FDA and has been authorized for detection and/or diagnosis of SARS-CoV-2 by FDA under an Emergency Use Authorization (EUA).  This EUA will remain in effect (meaning this test can be used) for the duration of the COVID-19 declaration under Section 564(b)(1) of the Act, 21 U.S.C. section 360bbb-3(b)(1), unless the authorization is terminated or revoked sooner.  Performed at [facility], [address], [place], [place] [address]     Blood Alcohol level:  Lab Results  Component Value Date   ETH <10 03/01/2023   ETH <5 08/12/2016    Physical Findings:  CIWA:    COWS:     Musculoskeletal: Strength & Muscle Tone: within normal limits Gait & Station: normal Patient leans: N/A  Psychiatric Specialty Exam:  Presentation  General Appearance:  Appropriate for Environment  Eye Contact: Minimal  Speech: Clear and Coherent  Speech Volume: Normal  Handedness:No data recorded  Mood and Affect  Mood: Euthymic  Affect: Flat   Thought Process  Thought Processes: Disorganized  Descriptions of Associations:Loose  Orientation:Full (Time, Place and Person)  Thought Content:WDL  History of Schizophrenia/Schizoaffective disorder:Yes  Duration of Psychotic Symptoms:Greater than six months  Hallucinations:Hallucinations: None  Ideas of Reference:None  Suicidal Thoughts:Suicidal Thoughts: No  Homicidal Thoughts:Homicidal Thoughts: No   Sensorium  Memory: Immediate Fair; Recent Fair  Judgment: Poor  Insight: Poor   Executive Functions  Concentration: Fair  Attention Span: Fair  Recall: Fair  Fund of Knowledge: Fair  Language: Fair   Psychomotor Activity  Psychomotor Activity: Psychomotor Activity: Normal   Assets  Assets: Communication Skills; Desire for Improvement; Social Support   Sleep  Sleep: Sleep: Good    Physical Exam: Physical Exam Eyes:     Pupils: Pupils are  equal, round, and reactive to light.  Musculoskeletal:     Cervical back: Normal range of motion.  Neurological:     Mental Status: He is alert.  Psychiatric:        Attention and Perception: Attention normal.        Mood and Affect: Mood is depressed. Affect is flat.        Speech: Speech normal.        Behavior: Behavior is withdrawn.        Thought Content: Thought content is delusional.        Judgment: Judgment normal.    Review of Systems  Constitutional: Negative.   Respiratory: Negative.    Musculoskeletal: Negative.   Psychiatric/Behavioral:         Delusional    Blood pressure 125/83, pulse 79, temperature 98.4 F (36.9 C), temperature source Oral, resp. rate 18, weight 71 kg, SpO2 97 %. Body mass index is 25.26 kg/m.    Medical Decision Making: Patient continues to require inpatient Psychiatric hospitalization. Patient [facility] today 03/04/23. Bed assignment 508-2.    Brilynn Biasi MOTLEY-MANGRUM, PMHNP 03/04/2023, 2:40 PM

## 2023-03-04 NOTE — ED Notes (Signed)
Lunch tray delivered.

## 2023-03-04 NOTE — Progress Notes (Signed)
   03/04/23 1818  Psych Admission Type (Psych Patients Only)  Admission Status Involuntary  Psychosocial Assessment  Patient Complaints Other (Comment) (Not being understood by people)  Eye Contact Fair  Facial Expression Animated  Affect Appropriate to circumstance;Blunted  Speech Soft  Interaction Assertive;Childlike  Motor Activity Other (Comment) (WNL)  Appearance/Hygiene In scrubs  Behavior Characteristics Cooperative  Mood Preoccupied  Aggressive Behavior  Effect No apparent injury  Thought Process  Coherency Loose associations  Content Delusions;Preoccupation;Paranoia  Delusions Paranoid  Perception Illusions  Hallucination Visual  Judgment Impaired  Confusion Mild  Danger to Self  Current suicidal ideation? Denies  Danger to Others  Danger to Others None reported or observed

## 2023-03-04 NOTE — ED Provider Notes (Signed)
Emergency Medicine Observation Re-evaluation Note  Brian Christian is a 42 y.o. male, seen on rounds today.  Pt initially presented to the ED for complaints of Weakness Currently, the patient is resting.Marland Kitchen  Physical Exam  BP 125/83 (BP Location: Right Arm)   Pulse 79   Temp 98.4 F (36.9 C) (Oral)   Resp 18   Wt 71 kg   SpO2 97%   BMI 25.26 kg/m  Physical Exam  ED Course / MDM  EKG:EKG Interpretation  Date/Time:  Tuesday March 01 2023 21:47:20 EDT Ventricular Rate:  71 PR Interval:  135 QRS Duration: 94 QT Interval:  399 QTC Calculation: 434 R Axis:   15 Text Interpretation: Sinus rhythm No significant change since last tracing Confirmed by Elayne Snare ([id number]) on 03/01/2023 11:44:27 PM  I have reviewed the labs performed to date as well as medications administered while in observation.  Recent changes in the last 24 hours include recommended for inpatient psychiatric admission by behavioral health team.  Plan  Current plan is for inpatient psychiatric admission.    Lorre Nick, MD 03/04/23 [phone]

## 2023-03-05 DIAGNOSIS — F22 Delusional disorders: Secondary | ICD-10-CM | POA: Diagnosis not present

## 2023-03-05 DIAGNOSIS — F203 Undifferentiated schizophrenia: Secondary | ICD-10-CM | POA: Insufficient documentation

## 2023-03-05 LAB — LIPID PANEL
Cholesterol: 97 mg/dL (ref 0–200)
HDL: 48 mg/dL (ref >40)
LDL Cholesterol: 41 mg/dL (ref 0–99)
Total CHOL/HDL Ratio: 2 RATIO
Triglycerides: 42 mg/dL (ref <150)
VLDL: 8 mg/dL (ref 0–40)

## 2023-03-05 LAB — HEMOGLOBIN A1C
Hgb A1c MFr Bld: 5.9 % — ABNORMAL HIGH (ref 4.8–5.6)
Mean Plasma Glucose: 122.63 mg/dL

## 2023-03-05 LAB — TSH: TSH: 3.142 u[IU]/mL (ref 0.350–4.500)

## 2023-03-05 MED ORDER — CARVEDILOL 3.125 MG PO TABS
3.1250 mg | ORAL_TABLET | Freq: Every day | ORAL | Status: DC
  Administered 2023-03-06: 3.125 mg via ORAL
  Filled 2023-03-05 (×4): qty 1

## 2023-03-05 MED ORDER — PANTOPRAZOLE SODIUM 40 MG PO TBEC
40.0000 mg | DELAYED_RELEASE_TABLET | Freq: Every day | ORAL | Status: DC
  Administered 2023-03-05 – 2023-03-06 (×2): 40 mg via ORAL
  Filled 2023-03-05 (×5): qty 1

## 2023-03-05 MED ORDER — SULFAMETHOXAZOLE-TRIMETHOPRIM 800-160 MG PO TABS
1.0000 | ORAL_TABLET | Freq: Every day | ORAL | Status: DC
  Administered 2023-03-05 – 2023-03-06 (×2): 1 via ORAL
  Filled 2023-03-05 (×5): qty 1

## 2023-03-05 MED ORDER — NICOTINE 21 MG/24HR TD PT24
21.0000 mg | MEDICATED_PATCH | Freq: Every day | TRANSDERMAL | Status: DC
  Administered 2023-03-05 – 2023-03-06 (×2): 21 mg via TRANSDERMAL
  Filled 2023-03-05 (×5): qty 1

## 2023-03-05 MED ORDER — ARIPIPRAZOLE 10 MG PO TABS
10.0000 mg | ORAL_TABLET | Freq: Every day | ORAL | Status: DC
  Filled 2023-03-05 (×3): qty 1

## 2023-03-05 MED ORDER — ACETAMINOPHEN 500 MG PO TABS
1000.0000 mg | ORAL_TABLET | Freq: Four times a day (QID) | ORAL | Status: DC | PRN
  Administered 2023-03-05 – 2023-03-06 (×4): 1000 mg via ORAL
  Filled 2023-03-05 (×4): qty 2

## 2023-03-05 MED ORDER — ARIPIPRAZOLE 5 MG PO TABS
5.0000 mg | ORAL_TABLET | Freq: Every day | ORAL | Status: AC
  Administered 2023-03-06: 5 mg via ORAL
  Filled 2023-03-05: qty 1

## 2023-03-05 MED ORDER — ALBUTEROL SULFATE HFA 108 (90 BASE) MCG/ACT IN AERS: 1.0000 | INHALATION_SPRAY | RESPIRATORY_TRACT | Status: DC | PRN

## 2023-03-05 MED ORDER — ATORVASTATIN CALCIUM 20 MG PO TABS
20.0000 mg | ORAL_TABLET | Freq: Every day | ORAL | Status: DC
  Administered 2023-03-05 – 2023-03-06 (×2): 20 mg via ORAL
  Filled 2023-03-05 (×4): qty 1

## 2023-03-05 MED ORDER — SULFAMETHOXAZOLE-TRIMETHOPRIM 800-160 MG PO TABS
1.0000 | ORAL_TABLET | Freq: Two times a day (BID) | ORAL | Status: DC
  Filled 2023-03-05 (×2): qty 1

## 2023-03-05 NOTE — BHH Group Notes (Signed)
[facility] Group Notes:  (Nursing/MHT/Case Management/Adjunct)  Date:  03/05/2023  Time:  9:24 PM  Type of Therapy:  Group Therapy  Participation Level:  None  Participation Quality:  Resistant  Affect:  Flat  Cognitive:  Lacking  Insight:  Lacking  Engagement in Group:  Poor  Modes of Intervention:  Education  Summary of Progress/Problems: attended but didn't participate.   Brian Christian 03/05/2023, 9:24 PM

## 2023-03-05 NOTE — BHH Suicide Risk Assessment (Signed)
Suicide Risk Assessment  Admission Assessment    [facility] Admission Suicide Risk Assessment   Nursing information obtained from:  Patient  Demographic factors:  Male, Low socioeconomic status, Unemployed  Current Mental Status:  NA  Loss Factors:  NA  Historical Factors:  NA  Risk Reduction Factors:  NA  Total Time spent with patient: 1 hour  Principal Problem: Delusion  Diagnosis:  Principal Problem:   Delusion  Subjective Data: See H&P.  Continued Clinical Symptoms:  Alcohol Use Disorder Identification Test Final Score (AUDIT): 0 The "Alcohol Use Disorders Identification Test", Guidelines for Use in Primary Care, Second Edition.  World Science writer [facility]). Score between 0-7:  no or low risk or alcohol related problems. Score between 8-15:  moderate risk of alcohol related problems. Score between 16-19:  high risk of alcohol related problems. Score 20 or above:  warrants further diagnostic evaluation for alcohol dependence and treatment.  CLINICAL FACTORS:   Schizophrenia:   Paranoid or undifferentiated type Currently Psychotic Unstable or Poor Therapeutic Relationship Previous Psychiatric Diagnoses and Treatments Medical Diagnoses and Treatments/Surgeries  Musculoskeletal: Strength & Muscle Tone: within normal limits Gait & Station: normal Patient leans: N/A  Psychiatric Specialty Exam:  Presentation  General Appearance:  Appropriate for Environment  Eye Contact: Minimal  Speech: Clear and Coherent  Speech Volume: Normal  Handedness:No data recorded  Mood and Affect  Mood: Euthymic  Affect: Flat  Thought Process  Thought Processes: Disorganized  Descriptions of Associations:Loose  Orientation:Full (Time, Place and Person)  Thought Content:WDL  History of Schizophrenia/Schizoaffective disorder:Yes  Duration of Psychotic Symptoms:Greater than six months  Hallucinations:Hallucinations: None  Ideas of Reference:None  Suicidal  Thoughts:Suicidal Thoughts: No  Homicidal Thoughts:Homicidal Thoughts: No  Sensorium  Memory: Immediate Fair; Recent Fair  Judgment: Poor  Insight: Poor  Executive Functions  Concentration: Fair  Attention Span: Fair  Recall: [employer] of Knowledge: Fair  Language: Fair  Psychomotor Activity  Psychomotor Activity: Psychomotor Activity: Normal  Assets  Assets: Communication Skills; Desire for Improvement; Social Support  Sleep  Sleep: Sleep: Good  Physical Exam: Blood pressure 118/85, pulse 77, temperature 98.4 F (36.9 C), temperature source Oral, resp. rate 16, height  (1.676 m), weight 87.8 kg, SpO2 100 %. Body mass index is 31.25 kg/m.  COGNITIVE FEATURES THAT CONTRIBUTE TO RISK:  Closed-mindedness, Loss of executive function, Polarized thinking, and Thought constriction (tunnel vision)    SUICIDE RISK:   Moderate:  Frequent suicidal ideation with limited intensity, and duration, some specificity in terms of plans, no associated intent, good self-control, limited dysphoria/symptomatology, some risk factors present, and identifiable protective factors, including available and accessible social support.  PLAN OF CARE: See H&P.  I certify that inpatient services furnished can reasonably be expected to improve the patient's condition.   Armandina Stammer, NP, pmhnp, fnp-bc. 03/05/2023, 8:46 AM

## 2023-03-05 NOTE — Plan of Care (Signed)
  Problem: Health Behavior/Discharge Planning: Goal: Compliance with therapeutic regimen will improve Outcome: Progressing   

## 2023-03-05 NOTE — H&P (Signed)
Psychiatric Admission Assessment Adult  Patient Identification: Brian Christian  MRN:  [id number]  Date of Evaluation:  03/05/2023  Chief Complaint:  Delusion [F22]  Principal Diagnosis: Undifferentiated Schizophrenia.  Diagnosis:  Principal Problem:   Delusion Active Problems:   Undifferentiated schizophrenia  History of Present Illness: This is the first psychiatric admission/evaluation in this [facility] for this 42 year old AA male with prior hx of substance use disorder (cocaine/THC) & chronic mental health issues. Per chart review, patient is an established mental health patient & has been on multiple psychiatric medications. It is unknown if patient is ever compliant with his treatment regimen, has a home or may be homeless as he presents as a poor historian. Chart review has shown that patient was brought to the ED by the EMS after being found near a dumpster. After medical evaluation/stabilization, he was cleared & transferred to the [facility] for further psychiatric evaluation.treatments. His UDS was positive for cannabis & cocaine. During this evaluation, Brian Christian reports,   "I was at the [facility] for a couple of days. The ambulance took me there. I don't know why or what happened that made them take me to the hospital because my mind is blank right now. I don't remember anything about me or my situation. I was on medications at home, but I don't know what they are. I don't know why I take medicines. I'm the king of [place] since the queen died. I'm king of the world. I don't know why you keep asking, I'm not depressed & I don't use drugs or alcohol". Guest currently denies any SIHI, AVH, delusional thoughts or paranoia. He does not appear to be responding to any internal stimuli.  Objective: This case is discussed with the attending psychiatric. The decision was made to stop the Seroquel & start patient on Abilify tablets to transition him to the Abilify maintenna by discharge. Will obtain  [id number].  Associated Signs/Symptoms:  Depression Symptoms:   Patient currently denies any symptoms of depression by saying "I'm not depressed".  (Hypo) Manic Symptoms:   Grandiosity, says he the "[place] of [place]".  Anxiety Symptoms:   Patient currently denies any symptoms of anxiety.  Psychotic Symptoms:  Delusions, Paranoia,  PTSD Symptoms: Unable to obtain this information. Patient currently presents as a poor historian, NA  Total Time spent with patient: 1 hour  Past Psychiatric History: Chart review indicated patient is an established mental health patient. Chart review reports indicated he has been to other psychiatric hospitals within the surrounding areas.  Is the patient at risk to self? No.  Has the patient been a risk to self in the past 6 months? Unsure, patient is unable to provide this information at this time.   Has the patient been a risk to self within the distant past? Unsure, patient is unable to provide this information.   Is the patient a risk to others? No.  Has the patient been a risk to others in the past 6 months? Unsure, patient is unable to provide this information.  Has the patient been a risk to others within the distant past Unsure, patient is unable to provide this information.    [place] Scale:  Flowsheet Row Ad[facility] (Current) [facility] from 03[facility]01/2023 in [facility] at [facility]  C-SSRS RISK CATEGORY No Risk No Risk      Prior Inpatient Therapy: Yes.   If yes, describe: [facility].   Prior  Outpatient Therapy: Yes.   If yes, describe: [facility].   Alcohol Screening: 1. How often do you have a drink containing alcohol?: Never 2. How many drinks containing alcohol do you have on a typical day when you are drinking?: 1 or 2 3. How often do you have six or more drinks on one occasion?: Never AUDIT-C Score: 0 4. How often during  the last year have you found that you were not able to stop drinking once you had started?: Never 5. How often during the last year have you failed to do what was normally expected from you because of drinking?: Never 6. How often during the last year have you needed a first drink in the morning to get yourself going after a heavy drinking session?: Never 7. How often during the last year have you had a feeling of guilt of remorse after drinking?: Never 8. How often during the last year have you been unable to remember what happened the night before because you had been drinking?: Never 9. Have you or someone else been injured as a result of your drinking?: No 10. Has a relative or friend or a doctor or another health worker been concerned about your drinking or suggested you cut down?: No Alcohol Use Disorder Identification Test Final Score (AUDIT): 0  Substance Abuse History in the last 12 months:  Yes.    Consequences of Substance Abuse: Discussed with patient during this admission evaluation. Medical Consequences:  Liver damage, Possible death by overdose Legal Consequences:  Arrests, jail time, Loss of driving privilege. Family Consequences:  Family discord, divorce and or separation.  Previous Psychotropic Medications: Yes   Psychological Evaluations: Yes   Past Medical History:  Past Medical History:  Diagnosis Date   HIV (human immunodeficiency virus infection)    Stroke    History reviewed. No pertinent surgical history.  Family History: History reviewed. No pertinent family history.  Family Psychiatric  History: Unsure, patient is unable to provide this information.  Tobacco Screening:  Social History   Tobacco Use  Smoking Status Every Day   Packs/day: .5   Types: Cigarettes  Smokeless Tobacco Never    BH Tobacco Counseling     Are you interested in Tobacco Cessation Medications?  No value filed. Counseled patient on smoking cessation:  No value filed. Reason  Tobacco Screening Not Completed: No value filed.       Social History: Single, has no children, unemployed, probably receiving disability benefits. Social History   Substance and Sexual Activity  Alcohol Use Yes   Comment: ocassionally     Social History   Substance and Sexual Activity  Drug Use Yes   Types: Marijuana    Additional Social History:   Allergies:   Allergies  Allergen Reactions   Olanzapine Swelling and Other (See Comments)   Other Anaphylaxis    PEANUTS, pt can tolerate walnuts & pecans   Peanut-Containing Drug Products Swelling and Other (See Comments)   Tramadol Other (See Comments)    dizzy   Gabapentin Swelling and Other (See Comments)    Face swelling   Ketorolac Swelling   Haloperidol Lactate Rash and Other (See Comments)    Muscle stiffness & tightness   Lab Results:  Results for orders placed or performed during the hospital encounter of 03/04/23 (from the past 48 hour(s))  TSH     Status: None   Collection Time: 03/05/23  6:49 AM  Result Value Ref Range   TSH 3.142 0.350 -  4.500 uIU/mL    Comment: Performed by a 3rd Generation assay with a functional sensitivity of <=0.01 uIU/mL. Performed at [facility], [address] [place], [place] [address]   Lipid panel     Status: None   Collection Time: 03/05/23  6:49 AM  Result Value Ref Range   Cholesterol 97 0 - 200 mg/dL   Triglycerides 42 <191 mg/dL   HDL 48 >47 mg/dL   Total CHOL/HDL Ratio 2.0 RATIO   VLDL 8 0 - 40 mg/dL   LDL Cholesterol 41 0 - 99 mg/dL    Comment:        Total Cholesterol/HDL:CHD Risk Coronary Heart Disease Risk Table                     Men   Women  1/2 Average Risk   3.4   3.3  Average Risk       5.0   4.4  2 X Average Risk   9.6   7.1  3 X Average Risk  23.4   11.0        Use the calculated Patient Ratio above and the CHD Risk Table to determine the patient's CHD Risk.        ATP III CLASSIFICATION (LDL):  <100     mg/dL   Optimal   829-562  mg/dL   Near or Above                    Optimal  130-159  mg/dL   Borderline  130-865  mg/dL   High  >784     mg/dL   Very High Performed at [facility], [address] [place], [place] [address]    Blood Alcohol level:  Lab Results  Component Value Date   [facility] <10 03/01/2023   ETH <5 08/12/2016   Metabolic Disorder Labs:  Lab Results  Component Value Date   HGBA1C 5.7 (H) 08/13/2016   MPG 117 08/13/2016   Lab Results  Component Value Date   PROLACTIN 8.6 08/13/2016   Lab Results  Component Value Date   CHOL 97 03/05/2023   TRIG 42 03/05/2023   HDL 48 03/05/2023   CHOLHDL 2.0 03/05/2023   VLDL 8 03/05/2023   LDLCALC 41 03/05/2023   LDLCALC 78 08/13/2016   Current Medications: Current Facility-Administered Medications  Medication Dose Route Frequency Provider Last Rate Last Admin   acetaminophen (TYLENOL) tablet 1,000 mg  1,000 mg Oral Q6H PRN Armandina Stammer I, NP       alum & mag hydroxide-simeth (MAALOX/MYLANTA) 200-200-20 MG/5ML suspension 30 mL  30 mL Oral Q4H PRN Motley-Mangrum, Ezra Sites, PMHNP       [START ON 03/07/2023] ARIPiprazole (ABILIFY) tablet 10 mg  10 mg Oral Daily Khari Mally I, NP       ARIPiprazole (ABILIFY) tablet 5 mg  5 mg Oral Daily Shawntina Diffee I, NP       busPIRone (BUSPAR) tablet 15 mg  15 mg Oral BID Motley-Mangrum, Jadeka A, PMHNP   15 mg at 03/05/23 0833   darunavir (PREZISTA) tablet 800 mg  800 mg Oral Q breakfast Motley-Mangrum, Jadeka A, PMHNP   800 mg at 03/05/23 0833   diphenhydrAMINE (BENADRYL) capsule 50 mg  50 mg Oral TID PRN Motley-Mangrum, Jadeka A, PMHNP   50 mg at 03/05/23 1058   Or   diphenhydrAMINE (BENADRYL) injection 50 mg  50 mg Intramuscular TID PRN Motley-Mangrum, Ezra Sites, PMHNP  divalproex (DEPAKOTE) DR tablet 500 mg  500 mg Oral BID Motley-Mangrum, Jadeka A, PMHNP   500 mg at 03/05/23 4401   elvitegravir-cobicistat-emtricitabine-tenofovir (GENVOYA) 150-150-200-10 MG tablet 1 tablet  1  tablet Oral Q breakfast Motley-Mangrum, Ezra Sites, PMHNP   1 tablet at 03/05/23 0272   hydrOXYzine (ATARAX) tablet 25 mg  25 mg Oral TID PRN Motley-Mangrum, Ezra Sites, PMHNP   25 mg at 03/05/23 0833   LORazepam (ATIVAN) tablet 2 mg  2 mg Oral TID PRN Motley-Mangrum, Ezra Sites, PMHNP   2 mg at 03/05/23 1058   Or   LORazepam (ATIVAN) injection 2 mg  2 mg Intramuscular TID PRN Motley-Mangrum, Jadeka A, PMHNP       magnesium hydroxide (MILK OF MAGNESIA) suspension 30 mL  30 mL Oral Daily PRN Motley-Mangrum, Jadeka A, PMHNP       nicotine (NICODERM CQ - dosed in mg/24 hours) patch 21 mg  21 mg Transdermal Daily Andreus Cure I, NP   21 mg at 03/05/23 1309   traZODone (DESYREL) tablet 50 mg  50 mg Oral QHS PRN Motley-Mangrum, Jadeka A, PMHNP       PTA Medications: Medications Prior to Admission  Medication Sig Dispense Refill Last Dose   atorvastatin (LIPITOR) 20 MG tablet Take 20 mg by mouth at bedtime.      carvedilol (COREG) 3.125 MG tablet Take 3.125 mg by mouth daily after breakfast.      divalproex (DEPAKOTE) 500 MG DR tablet Take 500 mg by mouth 2 (two) times daily.   Past Month   elvitegravir-cobicistat-emtricitabine-tenofovir (GENVOYA) 150-150-200-10 MG TABS tablet Take 1 tablet by mouth daily with breakfast.      methimazole (TAPAZOLE) 10 MG tablet Take 20 mg by mouth 3 (three) times daily.      omeprazole (PRILOSEC) 40 MG capsule Take 1 capsule by mouth in the morning and at bedtime.      sulfamethoxazole-trimethoprim (BACTRIM DS) 800-160 MG tablet Take 1 tablet by mouth 2 (two) times daily.      albuterol (VENTOLIN HFA) 108 (90 Base) MCG/ACT inhaler Inhale 1-2 puffs into the lungs every 4 (four) hours as needed for wheezing or shortness of breath.      busPIRone (BUSPAR) 15 MG tablet Take 30 mg by mouth 2 (two) times daily.      Darunavir Ethanolate (PREZISTA) 800 MG tablet Take 800 mg by mouth daily.      diazepam (VALIUM) 5 MG tablet Take 10 mg by mouth in the morning, at noon, and at  bedtime.      elvitegravir-cobicistat-emtricitabine-tenofovir (GENVOYA) 150-150-200-10 MG TABS tablet Take 1 tablet by mouth daily with breakfast.      Fluocinonide 0.1 % CREA Apply 1 Application topically 2 (two) times daily as needed.      hydrOXYzine (ATARAX) 25 MG tablet Take 25 mg by mouth every 8 (eight) hours as needed for anxiety.      imiquimod (ALDARA) 5 % cream Apply 1 Application topically at bedtime.      levothyroxine (SYNTHROID) 100 MCG tablet Take 100 mcg by mouth daily before breakfast.      PREZISTA 800 MG tablet Take 1 tablet by mouth daily. (Patient not taking: Reported on 03/02/2023)      QUEtiapine (SEROQUEL) 200 MG tablet Take 200 mg by mouth at bedtime.      triamcinolone ointment (KENALOG) 0.5 % Apply 1 Application topically 2 (two) times daily as needed (rash). Body rash      Musculoskeletal: Strength & Muscle Tone: within  normal limits Gait & Station: normal Patient leans: N/A  Psychiatric Specialty Exam:  Presentation  General Appearance:  Casual; Disheveled  Eye Contact: Poor  Speech: Clear and Coherent; Slow  Speech Volume: Decreased  Handedness:Right  Mood and Affect  Mood: -- (Patient currently denies any symptoms of depression.)  Affect: Restricted  Thought Process  Thought Processes: Disorganized  Duration of Psychotic Symptoms: Greater than 2 weeks.  Past Diagnosis of Schizophrenia or Psychoactive disorder: Yes  Descriptions of Associations:Loose  Orientation:Partial (Oriented to self only.)  Thought Content:Delusions; Scattered  Hallucinations:Hallucinations: None  Ideas of Reference:Delusions; Paranoia  Suicidal Thoughts:Suicidal Thoughts: -- (Unsure, patient is unable to provide this information.)  Homicidal Thoughts:Homicidal Thoughts: -- (Unsure, patient is unable to provide this information.)  Sensorium  Memory: Immediate Poor; Recent Poor; Remote Poor  Judgment: Impaired  Insight: Lacking  Executive  Functions  Concentration: Fair  Attention Span: Fair  Recall: Poor  Fund of Knowledge: Poor  Language: Fair  Psychomotor Activity  Psychomotor Activity: Psychomotor Activity: Decreased  Assets  Assets: Desire for Improvement  Sleep  Sleep: Sleep: Good Number of Hours of Sleep: 7.5  Physical Exam: Physical Exam Vitals and nursing note reviewed.  HENT:     Head: Normocephalic.     Nose: Nose normal.     Mouth/Throat:     Pharynx: Oropharynx is clear.  Eyes:     Pupils: Pupils are equal, round, and reactive to light.  Cardiovascular:     Rate and Rhythm: Normal rate.     Pulses: Normal pulses.  Pulmonary:     Effort: Pulmonary effort is normal.  Genitourinary:    Comments: Deferred Musculoskeletal:        General: Normal range of motion.     Cervical back: Normal range of motion.  Skin:    General: Skin is warm and dry.  Neurological:     General: No focal deficit present.     Mental Status: He is alert and oriented to person, place, and time.    Review of Systems  Constitutional:  Negative for chills, diaphoresis and fever.  HENT:  Negative for congestion and sore throat.   Eyes:  Negative for blurred vision.  Respiratory:  Negative for cough, shortness of breath and wheezing.   Cardiovascular:  Negative for chest pain and palpitations.  Gastrointestinal:  Negative for abdominal pain, constipation, diarrhea, heartburn, nausea and vomiting.  Genitourinary:  Negative for dysuria.  Musculoskeletal:  Negative for joint pain and myalgias.  Skin:  Negative for itching and rash.  Neurological:  Negative for dizziness, tingling, tremors, sensory change, speech change, focal weakness, seizures, loss of consciousness, weakness and headaches.  Endo/Heme/Allergies:        Allergies: Haldol, Gabapentin, Ketorolac, Olanzapine, Peanuts, Tramadol  Psychiatric/Behavioral:  Positive for hallucinations and substance abuse (UDS (+) for cocaine & THC). Negative for  depression, memory loss and suicidal ideas. The patient is nervous/anxious and has insomnia.    Blood pressure 118/85, pulse 77, temperature 98.4 F (36.9 C), temperature source Oral, resp. rate 16, height 5\' 6"  (1.676 m), weight 87.8 kg, SpO2 100 %. Body mass index is 31.25 kg/m.  Treatment Plan Summary: Daily contact with patient to assess and evaluate symptoms and progress in treatment and Medication management.   Principal/active diagnoses.  Undifferentiated schizophrenia.   Medical issues. HIV.  Plan: Discontinued Seroquel. Patient is started on Ability tables with plan to transition to the injectable form by discharge.  -Initiated Abilify 5 mg po daily for mood stability x 2  days.  -Increased Abilify from 5 mg to 10 mg for mood control (on 03-07-23).  -Continue Buspar 15 mg po bid for anxiety.  -Continue Vistaril 25 mg po tid prn for anxiety.  -Continue Depakote DR 500 mg po bid for mood stabilization.  -Continue Trazodone 50 mg po Q hs prn for anxiety.   Agitation protocols: Cont as recommended;  -Benadryl 50 mg po or IM tid prn.  -Lorazepam 2 mg po or IM tid prn.  Other medical issues. -Continue Darunavir 800 mg daily for HIV.  -Continue GENVOYA 150-150-120-10 mg po daily for HIV.  -Resumed Protonix 40 mg po Q am for acid reflux.  -Resumed Bactrim DS 800-160 mg for HIV related infections.  -Resumed on Coreg 3.125 mg po daily for HTN.  -Resumed Lipitor 20 mg po daily for hyperlipidemia.   Other PRNS -Continue Tylenol 650 mg every 6 hours PRN for mild pain -Continue Maalox 30 ml Q 4 hrs PRN for indigestion -Continue MOM 30 ml po Q 6 hrs for constipation  Safety and Monitoring: Voluntary admission to inpatient psychiatric unit for safety, stabilization and treatment Daily contact with patient to assess and evaluate symptoms and progress in treatment Patient's case to be discussed in multi-disciplinary team meeting Observation Level : q15 minute checks Vital signs:  q12 hours Precautions: Safety  Discharge Planning: Social work and case management to assist with discharge planning and identification of hospital follow-up needs prior to discharge Estimated LOS: 5-7 days Discharge Concerns: Need to establish a safety plan; Medication compliance and effectiveness Discharge Goals: Return home with outpatient referrals for mental health follow-up including medication management/psychotherapy  Observation Level/Precautions:  15 minute checks  Laboratory:   Per ED, current lab results reviewed  Psychotherapy: Enrolled in the group sessions.   Medications: See MAR.    Consultations: As needed.  Discharge Concerns: Safety, mood control.  Estimated LOS: 3-5 days.  Other: NA   Physician Treatment Plan for Primary Diagnosis: Delusion  Long Term Goal(s): Improvement in symptoms so as ready for discharge  Short Term Goals: Ability to identify changes in lifestyle to reduce recurrence of condition will improve, Ability to verbalize feelings will improve, Ability to disclose and discuss suicidal ideas, and Ability to demonstrate self-control will improve  Physician Treatment Plan for Secondary Diagnosis: Principal Problem:   Delusion Active Problems:   Undifferentiated schizophrenia  Long Term Goal(s): Improvement in symptoms so as ready for discharge  Short Term Goals: Ability to identify and develop effective coping behaviors will improve, Ability to maintain clinical measurements within normal limits will improve, Compliance with prescribed medications will improve, and Ability to identify triggers associated with substance abuse/mental health issues will improve  I certify that inpatient services furnished can reasonably be expected to improve the patient's condition.    Armandina Stammer, NP, pmhnp, fnp-bc. 4/20/20241:29 PM

## 2023-03-05 NOTE — [facility] Counselor (Signed)
Clinical Social Work Note  CSW attempted to complete Psychosocial Assessment with patient, but he was sleeping/snoring and could not be easily awakened.  CSW team will continue efforts to complete PSA.  Ambrose Mantle, LCSW 03/05/2023, 4:13 PM

## 2023-03-05 NOTE — Progress Notes (Signed)
   03/05/23 0911  Psych Admission Type (Psych Patients Only)  Admission Status Involuntary  Psychosocial Assessment  Patient Complaints Anxiety  Eye Contact Fair  Facial Expression Flat  Affect Flat  Speech Soft  Interaction Assertive;Guarded  Motor Activity Slow  Appearance/Hygiene In scrubs  Behavior Characteristics Cooperative;Calm  Mood Preoccupied  Aggressive Behavior  Effect No apparent injury  Thought Process  Coherency Loose associations  Content Delusions;Preoccupation  Delusions Paranoid  Perception Illusions  Hallucination Visual  Judgment Impaired  Confusion None  Danger to Self  Current suicidal ideation? Denies  Danger to Others  Danger to Others None reported or observed

## 2023-03-05 NOTE — Progress Notes (Signed)
Patient asking for Valium and Percocet. When told he does not have orders for those medications, patient became upset saying "It's going to be a problem if I don't get my medicine." Patient is talking with slurred speech which was absent during his admission on yesterday. Patient was given PRN agitation protocol this morning and Prn Vistaril this afternoon. Patient was asleep and had to be awaken to take afternoon medication. Upon waking, patient reported anxiety unrelieved by anything other than Valium.

## 2023-03-06 DIAGNOSIS — F22 Delusional disorders: Secondary | ICD-10-CM | POA: Diagnosis not present

## 2023-03-06 LAB — HEMOGLOBIN A1C
Hgb A1c MFr Bld: 5.8 % — ABNORMAL HIGH (ref 4.8–5.6)
Mean Plasma Glucose: 119.76 mg/dL

## 2023-03-06 MED ORDER — MELATONIN 5 MG PO TABS: 5.0000 mg | ORAL_TABLET | Freq: Every evening | ORAL | Status: DC | PRN

## 2023-03-06 NOTE — Progress Notes (Signed)
Pt up asking for pain medication constantly.  Pt also asking for medication he does not have orders for.  Pt threatening to staff when denied medications.  Explained to Pt he has to wait another hour before his Tylenol is due.  Pt angry when asked to return to room.  Stated "I will slap you" to his RN.  Explained to Pt that we will let him know when it is time that he can next get his ordered pain medication.

## 2023-03-06 NOTE — Progress Notes (Signed)
   03/06/23 2300  Psych Admission Type (Psych Patients Only)  Admission Status Involuntary  Psychosocial Assessment  Patient Complaints Anxiety  Eye Contact Staring  Facial Expression Flat  Affect Labile  Speech Argumentative;Pressured  Interaction Needy  Motor Activity Slow  Appearance/Hygiene In scrubs  Behavior Characteristics Anxious  Mood Anxious  Thought Process  Coherency Loose associations  Content Delusions;Preoccupation;Obsessions  Delusions Paranoid  Perception Illusions  Hallucination Visual  Judgment Limited  Confusion None  Danger to Self  Current suicidal ideation? Denies  Danger to Others  Danger to Others None reported or observed

## 2023-03-06 NOTE — Progress Notes (Signed)
   03/05/23 2200  Psych Admission Type (Psych Patients Only)  Admission Status Involuntary  Psychosocial Assessment  Patient Complaints Anxiety;Irritability  Eye Contact Staring  Facial Expression Flat  Affect Labile  Speech Argumentative;Pressured  Interaction Needy  Motor Activity Slow  Appearance/Hygiene In scrubs  Behavior Characteristics Agitated  Mood Labile  Thought Process  Coherency Loose associations  Content Delusions;Preoccupation;Obsessions  Delusions Paranoid  Perception Illusions  Hallucination Visual  Judgment Limited  Confusion None  Danger to Self  Current suicidal ideation? Denies  Danger to Others  Danger to Others None reported or observed

## 2023-03-06 NOTE — Plan of Care (Signed)
  Problem: Education: Goal: Knowledge of [facility] Education information/materials will improve Outcome: Progressing Goal: Emotional status will improve Outcome: Progressing Goal: Mental status will improve Outcome: Progressing Goal: Verbalization of understanding the information provided will improve Outcome: Progressing   Problem: Coping: Goal: Ability to verbalize frustrations and anger appropriately will improve Outcome: Progressing Goal: Ability to demonstrate self-control will improve Outcome: Progressing   

## 2023-03-06 NOTE — BHH Counselor (Signed)
Adult Comprehensive Assessment  Patient ID: Brian Christian, male   DOB: 18-Mar-1981, 42 y.o.   MRN: [id number]  Information Source: Information source: Patient  Current Stressors:  Patient states their primary concerns and needs for treatment are:: "Needed a ride back home" Patient states their goals for this hospitilization and ongoing recovery are:: "To get better" Educational / Learning stressors: Denies stressor Employment / Job issues: Unemployed. Denies stressor Family Relationships: Yes, states his whole family causes him mental stress Financial / Lack of resources (include bankruptcy): Denies source of income however, states he has not stress due to "being rich" Housing / Lack of housing: States he has been staying at a hotel in [place] Physical health (include injuries & life threatening diseases): Yes, foot pain Social relationships: Denies stressor Substance abuse: THC, cocaine use, tobacco use Bereavement / Loss: Yes, of whole family  Living/Environment/Situation:  Living Arrangements: Alone Living conditions (as described by patient or guardian): States he has been living at [facility] [employer] in [place] Who else lives in the home?: Self How long has patient lived in current situation?: unknown What is atmosphere in current home:  (Stressful)  Family History:  Marital status: Single Are you sexually active?: No What is your sexual orientation?: UTA Has your sexual activity been affected by drugs, alcohol, medication, or emotional stress?: Unknown Does patient have children?: Yes How many children?: 1 How is patient's relationship with their children?: States he had a daughter who was murdered. Unable to provide further details  Childhood History:  By whom was/is the patient raised?: Grandparents Additional childhood history information: Reports being a happy kid Description of patient's relationship with caregiver when they were a child: Good Patient's  description of current relationship with people who raised him/her: Both grandparents deceased How were you disciplined when you got in trouble as a child/adolescent?: Whooped Does patient have siblings?: Yes Number of Siblings: 6 Description of patient's current relationship with siblings: No relationships Did patient suffer any verbal/emotional/physical/sexual abuse as a child?: Yes Did patient suffer from severe childhood neglect?: No Has patient ever been sexually abused/assaulted/raped as an adolescent or adult?: No Was the patient ever a victim of a crime or a disaster?: No Witnessed domestic violence?: No Has patient been affected by domestic violence as an adult?: No  Education:  Highest grade of school patient has completed: Some college Currently a Consulting civil engineer?: No Learning disability?: No  Employment/Work Situation:   Employment Situation: Unemployed Patient's Job has Been Impacted by Current Illness: No What is the Longest Time Patient has Held a Job?: UTA Where was the Patient Employed at that Time?: UTA Has Patient ever Been in the [employer]?: No  Financial Resources:   Surveyor, quantity resources:  (Unsure of current income) Does patient have a Lawyer or guardian?: No  Alcohol/Substance Abuse:   What has been your use of drugs/alcohol within the last 12 months?: Acknowledges using THC. Denies cocaine use although UDS positive for cocaine. Denies alcohol use. If attempted suicide, did drugs/alcohol play a role in this?: No Alcohol/Substance Abuse Treatment Hx: Past Tx, Outpatient Has alcohol/substance abuse ever caused legal problems?: No  Social Support System:   Forensic psychologist System: None Describe Community Support System: N/A Type of faith/religion: UTA How does patient's faith help to cope with current illness?: UTA  Leisure/Recreation:   Do You Have Hobbies?: No  Strengths/Needs:   What is the patient's perception of their strengths?:  UTA Patient states they can use these personal strengths during their  treatment to contribute to their recovery: N/A Patient states these barriers may affect/interfere with their treatment: None Patient states these barriers may affect their return to the community: None Other important information patient would like considered in planning for their treatment: None  Discharge Plan:   Currently receiving community mental health services: Yes (From Whom) ([facility] in [place]) Patient states concerns and preferences for aftercare planning are: Continue services with [facility] Patient states they will know when they are safe and ready for discharge when: Yes, when has ride Does patient have access to transportation?: No Does patient have financial barriers related to discharge medications?: Yes Patient description of barriers related to discharge medications: Out of state Medicaid Plan for no access to transportation at discharge: Will continue to assess Will patient be returning to same living situation after discharge?: Yes  Summary/Recommendations:   Summary and Recommendations (to be completed by the evaluator): Brian Christian was admitted due to being found near dumpster, delusional, AVH. Pt has a hx of substance use dx and chronic mental health issues. Recent stressors include housing situation, lack of support, active substance use, family stress. Pt currently sees provider at [facility] in [facility] as an outpatient provider. While here, Brian Christian can benefit from crisis stabilization, medication management, therapeutic milieu, and referrals for services.  Brian Christian A Brian Christian. 03/06/2023

## 2023-03-06 NOTE — Progress Notes (Signed)
Aquil standing at the nursing station demanding to get his medication. RN explain it is not time for med's.

## 2023-03-06 NOTE — Progress Notes (Signed)
[facility] MD Progress Note  03/06/2023 2:45 PM Brian Christian  MRN:  [id number]  Reason for admission: This is the first psychiatric admission/evaluation in this [facility] for this 42 year old AA male with prior hx of substance use disorder (cocaine/THC) & chronic mental health issues. Per chart review, patient is an established mental health patient & has been on multiple psychiatric medications. It is unknown if patient is ever compliant with his treatment regimen, has a home or may be homeless as he presents as a poor historian. Chart review has shown that patient was brought to the ED by the EMS after being found near a dumpster. After medical evaluation/stabilization, he was cleared & transferred to the [facility] for further psychiatric evaluation.treatments. His UDS was positive for cannabis & cocaine.   Daily notes: Brian Christian is seen in his room. Chart reviewed. The chart findings discussed with the treatment team. He presents alert, oriented to himself & place. He is making a good eye contact, verbally responsive. He reports, "I'm trying to be okay. Where is my Ativan? I have been on Ativan for a long time. I don't remember the name of the doctor that has been prescribing it to me. I'm still forgetful, I can't remember much. I can't answer many questions like that because I don't have any memory left. Are you going to order my Ativan now?". Brian Christian since his admission keep answering "I don't know or I don't remember to the assessment questions". He presents as a poor historian just like on admission. He is visible on the unit during meal times & medication administration. And most other times, he is lying down in his bed. He is being encouraged to come out of his room & attend group sessions. A review of the PDMP has shown no evidence or record of any control substances filled by any pharmacy for this patient. He is currently on Buspar 15 mg bid & Hydroxyzine 25 mg prn  for anxiety. He is allergic to gabapentin. He currently  denies any SIHI, AVH, delusional thoughts or paranoia. He does not appear to be responding to any internal stimuli. There are no changes made on the current plan of care. Will continue as already in progress.  Principal Problem: Delusion Diagnosis: Principal Problem:   Delusion Active Problems:   Undifferentiated schizophrenia  Total Time spent with patient:  35 minutes  Past Psychiatric History: See H&P.  Past Medical History:  Past Medical History:  Diagnosis Date   HIV (human immunodeficiency virus infection)    Stroke    History reviewed. No pertinent surgical history.  Family History: History reviewed. No pertinent family history.  Family Psychiatric  History: See H&P.  Social History:  Social History   Substance and Sexual Activity  Alcohol Use Yes   Comment: ocassionally     Social History   Substance and Sexual Activity  Drug Use Yes   Types: Marijuana    Social History   Socioeconomic History   Marital status: Single    Spouse name: Not on file   Number of children: Not on file   Years of education: Not on file   Highest education level: Not on file  Occupational History   Not on file  Tobacco Use   Smoking status: Every Day    Packs/day: .5    Types: Cigarettes   Smokeless tobacco: Never  Substance and Sexual Activity   Alcohol use: Yes    Comment: ocassionally   Drug use: Yes    Types:  Marijuana   Sexual activity: Not on file  Other Topics Concern   Not on file  Social History Narrative   Not on file   Social Determinants of Health   Financial Resource Strain: Not on file  Food Insecurity: Patient Unable To Answer (03/04/2023)   Hunger Vital Sign    Worried About Running Out of Food in the Last Year: Patient unable to answer    Ran Out of Food in the Last Year: Patient unable to answer  Transportation Needs: Patient Unable To Answer (03/04/2023)   PRAPARE - Administrator, Civil Service (Medical): Patient unable to answer     Lack of Transportation (Non-Medical): Patient unable to answer  Physical Activity: Not on file  Stress: Not on file  Social Connections: Not on file   Additional Social History:    Sleep: Good  Appetite:  Good  Current Medications: Current Facility-Administered Medications  Medication Dose Route Frequency Provider Last Rate Last Admin   acetaminophen (TYLENOL) tablet 1,000 mg  1,000 mg Oral Q6H PRN Armandina Stammer I, NP   1,000 mg at 03/06/23 1007   albuterol (VENTOLIN HFA) 108 (90 Base) MCG/ACT inhaler 1-2 puff  1-2 puff Inhalation Q4H PRN Montrice Montuori, Nicole Kindred I, NP       alum & mag hydroxide-simeth (MAALOX/MYLANTA) 200-200-20 MG/5ML suspension 30 mL  30 mL Oral Q4H PRN Motley-Mangrum, Ezra Sites, PMHNP       [START ON 03/07/2023] ARIPiprazole (ABILIFY) tablet 10 mg  10 mg Oral Daily Elon Eoff I, NP       ARIPiprazole (ABILIFY) tablet 5 mg  5 mg Oral Daily Jonty Morrical I, NP   5 mg at 03/06/23 1001   atorvastatin (LIPITOR) tablet 20 mg  20 mg Oral QHS Debera Sterba, Nicole Kindred I, NP   20 mg at 03/05/23 2056   busPIRone (BUSPAR) tablet 15 mg  15 mg Oral BID Motley-Mangrum, Jadeka A, PMHNP   15 mg at 03/06/23 1000   carvedilol (COREG) tablet 3.125 mg  3.125 mg Oral QPC breakfast Armandina Stammer I, NP   3.125 mg at 03/06/23 1002   darunavir (PREZISTA) tablet 800 mg  800 mg Oral Q breakfast Motley-Mangrum, Jadeka A, PMHNP   800 mg at 03/06/23 1001   diphenhydrAMINE (BENADRYL) capsule 50 mg  50 mg Oral TID PRN Motley-Mangrum, Jadeka A, PMHNP   50 mg at 03/05/23 2200   Or   diphenhydrAMINE (BENADRYL) injection 50 mg  50 mg Intramuscular TID PRN Motley-Mangrum, Jadeka A, PMHNP       divalproex (DEPAKOTE) DR tablet 500 mg  500 mg Oral BID Motley-Mangrum, Jadeka A, PMHNP   500 mg at 03/06/23 1002   elvitegravir-cobicistat-emtricitabine-tenofovir (GENVOYA) 150-150-200-10 MG tablet 1 tablet  1 tablet Oral Q breakfast Motley-Mangrum, Jadeka A, PMHNP   1 tablet at 03/06/23 1001   hydrOXYzine (ATARAX) tablet 25 mg  25 mg Oral  TID PRN Motley-Mangrum, Jadeka A, PMHNP   25 mg at 03/05/23 2057   LORazepam (ATIVAN) tablet 2 mg  2 mg Oral TID PRN Motley-Mangrum, Jadeka A, PMHNP   2 mg at 03/05/23 2200   Or   LORazepam (ATIVAN) injection 2 mg  2 mg Intramuscular TID PRN Motley-Mangrum, Jadeka A, PMHNP       magnesium hydroxide (MILK OF MAGNESIA) suspension 30 mL  30 mL Oral Daily PRN Motley-Mangrum, Jadeka A, PMHNP       nicotine (NICODERM CQ - dosed in mg/24 hours) patch 21 mg  21 mg Transdermal Daily Armandina Stammer I,  NP   21 mg at 03/06/23 1002   pantoprazole (PROTONIX) EC tablet 40 mg  40 mg Oral Daily Armandina Stammer I, NP   40 mg at 03/06/23 1002   sulfamethoxazole-trimethoprim (BACTRIM DS) 800-160 MG per tablet 1 tablet  1 tablet Oral Daily Armandina Stammer I, NP   1 tablet at 03/06/23 1001   traZODone (DESYREL) tablet 50 mg  50 mg Oral QHS PRN Motley-Mangrum, Ezra Sites, PMHNP   50 mg at 03/05/23 2057   Lab Results:  Results for orders placed or performed during the hospital encounter of 03/04/23 (from the past 48 hour(s))  Hemoglobin A1c     Status: Abnormal   Collection Time: 03/05/23  6:49 AM  Result Value Ref Range   Hgb A1c MFr Bld 5.9 (H) 4.8 - 5.6 %    Comment: (NOTE) Pre diabetes:          5.7%-6.4%  Diabetes:              >6.4%  Glycemic control for   <7.0% adults with diabetes    Mean Plasma Glucose 122.63 mg/dL    Comment: Performed at [facility], [address], [place], [place] [address]  TSH     Status: None   Collection Time: 03/05/23  6:49 AM  Result Value Ref Range   TSH 3.142 0.350 - 4.500 uIU/mL    Comment: Performed by a 3rd Generation assay with a functional sensitivity of <=0.01 uIU/mL. Performed at [facility], [address] [place], [place] [address]   Lipid panel     Status: None   Collection Time: 03/05/23  6:49 AM  Result Value Ref Range   Cholesterol 97 0 - 200 mg/dL   Triglycerides 42 <098 mg/dL   HDL 48 >11 mg/dL   Total CHOL/HDL Ratio 2.0 RATIO    VLDL 8 0 - 40 mg/dL   LDL Cholesterol 41 0 - 99 mg/dL    Comment:        Total Cholesterol/HDL:CHD Risk Coronary Heart Disease Risk Table                     Men   Women  1/2 Average Risk   3.4   3.3  Average Risk       5.0   4.4  2 X Average Risk   9.6   7.1  3 X Average Risk  23.4   11.0        Use the calculated Patient Ratio above and the CHD Risk Table to determine the patient's CHD Risk.        ATP III CLASSIFICATION (LDL):  <100     mg/dL   Optimal  914-782  mg/dL   Near or Above                    Optimal  130-159  mg/dL   Borderline  956-213  mg/dL   High  >086     mg/dL   Very High Performed at [facility], [address], [place], [place] [address]    Blood Alcohol level:  Lab Results  Component Value Date   [facility] <10 03/01/2023   ETH <5 08/12/2016   Metabolic Disorder Labs: Lab Results  Component Value Date   HGBA1C 5.9 (H) 03/05/2023   MPG 122.63 03/05/2023   MPG 117 08/13/2016   Lab Results  Component Value Date   PROLACTIN 8.6 08/13/2016   Lab Results  Component Value Date   CHOL 97 03/05/2023   TRIG 42 03/05/2023   HDL 48 03/05/2023   CHOLHDL 2.0 03/05/2023   VLDL 8 03/05/2023   LDLCALC 41 03/05/2023   LDLCALC 78 08/13/2016    Physical Findings: AIMS:  , ,  ,  ,    CIWA:    COWS:     Musculoskeletal: Strength & Muscle Tone: within normal limits Gait & Station: normal Patient leans: N/A  Psychiatric Specialty Exam:  Presentation  General Appearance:  Casual; Disheveled  Eye Contact: Poor  Speech: Clear and Coherent; Slow  Speech Volume: Decreased  Handedness: Right  Mood and Affect  Mood: -- (Patient currently denies any symptoms of depression.)  Affect: Restricted  Thought Process  Thought Processes: Disorganized  Descriptions of Associations:Loose  Orientation:Partial (Oriented to self only.)  Thought Content:Delusions; Scattered  History of Schizophrenia/Schizoaffective  disorder:Yes  Duration of Psychotic Symptoms:Greater than six months  Hallucinations:No data recorded Ideas of Reference:Delusions; Paranoia  Suicidal Thoughts:Suicidal Thoughts: -- (Unsure, patient is unable to provide this information.)  Homicidal Thoughts:Homicidal Thoughts: -- (Unsure, patient is unable to provide this information.)  Sensorium  Memory: Immediate Poor; Recent Poor; Remote Poor  Judgment: Impaired  Insight: Lacking   Executive Functions  Concentration: Fair  Attention Span: Fair  Recall: Poor  Fund of Knowledge: Poor  Language: Fair  Psychomotor Activity  Psychomotor Activity: Psychomotor Activity: Decreased  Assets  Assets: Desire for Improvement  Sleep  Sleep: Sleep: Good Number of Hours of Sleep: 7.5  Physical Exam: Physical Exam Vitals and nursing note reviewed.  HENT:     Nose: Nose normal.  Cardiovascular:     Rate and Rhythm: Normal rate.     Pulses: Normal pulses.  Pulmonary:     Effort: Pulmonary effort is normal.  Genitourinary:    Comments: Deferred Musculoskeletal:        General: Normal range of motion.     Cervical back: Normal range of motion.  Skin:    General: Skin is warm and dry.  Neurological:     General: No focal deficit present.     Mental Status: He is alert and oriented to person, place, and time.    Review of Systems  Constitutional:  Negative for chills, diaphoresis and fever.  HENT:  Negative for congestion and sore throat.   Eyes:  Negative for blurred vision.  Respiratory:  Negative for cough, shortness of breath and wheezing.   Cardiovascular:  Negative for chest pain and palpitations.  Gastrointestinal:  Negative for abdominal pain, constipation, diarrhea, heartburn, nausea and vomiting.  Genitourinary:  Negative for dysuria.  Musculoskeletal:  Negative for joint pain and myalgias.  Neurological:  Negative for dizziness, tingling, tremors, sensory change, speech change, focal weakness,  seizures, loss of consciousness, weakness and headaches.  Endo/Heme/Allergies:        Deferred  Psychiatric/Behavioral:  Positive for depression and substance abuse (Hx cocaine & THC use disorder). Negative for hallucinations, memory loss and suicidal ideas. The patient is not nervous/anxious and does not have insomnia.    Blood pressure 117/73, pulse 92, temperature 98.4 F (36.9 C), temperature source Oral, resp. rate 16, height  (1.676 m), weight 87.8 kg, SpO2 99 %. Body mass index is 31.25 kg/m.  Treatment Plan Summary: Daily contact with patient to assess and evaluate symptoms and progress in treatment and Medication management.   Continue inpatient hospitalization.  Will continue today 03/06/2023 plan as below except where it is noted.   Principal/active diagnoses.  Undifferentiated schizophrenia.    Medical issues. HIV.  Plan: Discontinued Seroquel. Patient is started on Ability tables with plan to transition to the injectable form by discharge.  -Completed Abilify 5 mg po daily for mood stability x 2 days.  -Continue Abilify from 5 mg to 10 mg for mood control (on 03-07-23).  -Continue Buspar 15 mg po bid for anxiety.  -Continue Vistaril 25 mg po tid prn for anxiety.  -Continue Depakote DR 500 mg po bid for mood stabilization.  -Continue Trazodone 50 mg po Q hs prn for anxiety.    Agitation protocols: Cont as recommended;  -Benadryl 50 mg po or IM tid prn.  -Lorazepam 2 mg po or IM tid prn.  Other medical issues. -Continue Darunavir 800 mg daily for HIV.  -Continue GENVOYA 150-150-120-10 mg po daily for HIV.  -Continue Protonix 40 mg po Q am for acid reflux.  -Continue Bactrim DS 800-160 mg for HIV related infections.  -Continue  Coreg 3.125 mg po daily for HTN.  -Continue Lipitor 20 mg po daily for hyperlipidemia.    Other PRNS -Continue Tylenol 650 mg every 6 hours PRN for mild pain -Continue Maalox 30 ml Q 4 hrs PRN for indigestion -Continue MOM 30 ml po Q 6  hrs for constipation   Safety and Monitoring: Voluntary admission to inpatient psychiatric unit for safety, stabilization and treatment Daily contact with patient to assess and evaluate symptoms and progress in treatment Patient's case to be discussed in multi-disciplinary team meeting Observation Level : q15 minute checks Vital signs: q12 hours Precautions: Safety   Discharge Planning: Social work and case management to assist with discharge planning and identification of hospital follow-up needs prior to discharge Estimated LOS: 5-7 days Discharge Concerns: Need to establish a safety plan; Medication compliance and effectiveness Discharge Goals: Return home with outpatient referrals for mental health follow-up including medication management/psychotherapy  Armandina Stammer, NP, pmhnp, fnp-bc. 03/06/2023, 2:45 PM

## 2023-03-06 NOTE — Progress Notes (Signed)
Pt is constantly asking for medication, pt complained of left toe pain 9/10, pt was given tylenol 1000 mg PO at 2055, pt came back 30 minutes later complaining of pain  and demanded to be given something for pain. Pt stated he takes percocet at home. Pt also wanted to Buspar, Depakote and Abilify, when told he was told he could not have them, pt became agitated and verbally abusive towards staff. Pt was medicated with benadryl 50 mg and Ativan 2 mg PO. This did not help as pt constantly kept coming back asking for more medication. Pt has not been able to go sleep, will continue to monitor.

## 2023-03-06 NOTE — Progress Notes (Signed)
   03/06/23 1000  Psych Admission Type (Psych Patients Only)  Admission Status Involuntary  Psychosocial Assessment  Patient Complaints Anxiety;Anger;Irritability  Eye Contact Glaring  Facial Expression Flat;Pained  Affect Irritable;Labile  Speech Argumentative  Interaction Needy  Motor Activity Slow  Appearance/Hygiene In scrubs  Behavior Characteristics Agitated  Mood Labile  Aggressive Behavior  Effect No apparent injury  Thought Process  Coherency Loose associations  Content Ambivalence;Preoccupation  Delusions Controlling  Perception UTA  Hallucination None reported or observed  Judgment Limited  Confusion None  Danger to Self  Current suicidal ideation? Denies  Danger to Others  Danger to Others None reported or observed

## 2023-03-07 DIAGNOSIS — F22 Delusional disorders: Secondary | ICD-10-CM | POA: Diagnosis not present

## 2023-03-07 MED ORDER — GENVOYA 150-150-200-10 MG PO TABS: 1.0000 | ORAL_TABLET | Freq: Every day | ORAL | 0 refills | Status: AC

## 2023-03-07 MED ORDER — PANTOPRAZOLE SODIUM 40 MG PO TBEC: 40.0000 mg | DELAYED_RELEASE_TABLET | Freq: Every day | ORAL | 0 refills | Status: AC

## 2023-03-07 MED ORDER — ARIPIPRAZOLE 10 MG PO TABS: 10.0000 mg | ORAL_TABLET | Freq: Every day | ORAL | 0 refills | Status: AC

## 2023-03-07 MED ORDER — DARUNAVIR 800 MG PO TABS: 800.0000 mg | ORAL_TABLET | Freq: Every day | ORAL | 0 refills | Status: AC

## 2023-03-07 MED ORDER — CARVEDILOL 3.125 MG PO TABS: 3.1250 mg | ORAL_TABLET | Freq: Every day | ORAL | 0 refills | Status: AC

## 2023-03-07 MED ORDER — NICOTINE 21 MG/24HR TD PT24: 21.0000 mg | MEDICATED_PATCH | Freq: Every day | TRANSDERMAL | 0 refills | Status: AC

## 2023-03-07 MED ORDER — HYDROXYZINE HCL 25 MG PO TABS: 25.0000 mg | ORAL_TABLET | Freq: Three times a day (TID) | ORAL | 0 refills | Status: AC | PRN

## 2023-03-07 MED ORDER — MELATONIN 5 MG PO TABS: 5.0000 mg | ORAL_TABLET | Freq: Every evening | ORAL | 0 refills | Status: AC | PRN

## 2023-03-07 MED ORDER — ATORVASTATIN CALCIUM 20 MG PO TABS: 20.0000 mg | ORAL_TABLET | Freq: Every day | ORAL | 0 refills | Status: AC

## 2023-03-07 NOTE — Progress Notes (Signed)
Pt has been constantly asking for medications, pt stated he has been taking Ativan all his life, why is he not been given. Pt then stated that he wants to go to the ED because we are not giving him his medication. Pt started complaining of chest pain, V/S checked BP 144/118, P. 82, 02. 99, Temp 98.4 and R. 20. The writer tried to check BP manually but refused. EKG performed and is normal. Pt then walked to the dayroom and fixed himself coffee. Pt is currently calm in the day room drinking coffee. Provider on call made aware, will continue to monitor

## 2023-03-07 NOTE — Progress Notes (Signed)
Brian Christian had three cups of coffee this morning and wanting to eat breakfast.RN notify

## 2023-03-07 NOTE — Progress Notes (Signed)
Patient ID: Brian Christian, male   DOB: [birth year], 42 y.o.   MRN: [id number]   Pt reports that he is agitated "since last night and my mind is going everywhere." Pt requested medication. Ativan 2 mg PRN given.

## 2023-03-07 NOTE — Plan of Care (Signed)
  Problem: Education: Goal: Knowledge of [facility] Education information/materials will improve Outcome: Completed/Met Goal: Emotional status will improve Outcome: Completed/Met Goal: Mental status will improve Outcome: Completed/Met Goal: Verbalization of understanding the information provided will improve Outcome: Completed/Met   Problem: Activity: Goal: Interest or engagement in activities will improve Outcome: Completed/Met Goal: Sleeping patterns will improve Outcome: Completed/Met   Problem: Coping: Goal: Ability to verbalize frustrations and anger appropriately will improve Outcome: Completed/Met Goal: Ability to demonstrate self-control will improve Outcome: Completed/Met   

## 2023-03-07 NOTE — Progress Notes (Addendum)
Patient refused his medication this morning, he requested for ativan that was not scheduled for him. According to him, "where is my ativan?' When nurse informed him he does not have any ativan scheduled, patient states "If you don't give me my ativan, I'm not taking any medication from you."  He left the medication window and went to the day room.Patient denies SI/HI/AVH. Patient is now in the day room with other patients, and no distress noted. Staff will continue to provide support to patient.

## 2023-03-07 NOTE — Progress Notes (Addendum)
  [facility] Adult Case Management Discharge Plan :  Will you be returning to the same living situation after discharge:  Pt said that he is going to [facility]; [address] [place] [address] At discharge, do you have transportation home?: No.CSW has to arrange for a Taxi  Do you have the ability to pay for your medications: Yes,  Pt has Out of [facility]   Release of information consent forms completed and in the chart;  Patient's signature needed at discharge.  Patient to Follow up at:  Follow-up Information     Services, [facility]. Go on 03/10/2023.   Why: You have a scheduled therapy and medication management hospital follow up appointment with this provider Thursday 03/10/2023 @ 8:30 AM. Benay Pillow information: [address] [place] [address] [phone]                 Next level of care provider has access to [facility] Link:no  Safety Planning and Suicide Prevention discussed: No.Pt refused      Has patient been referred to the Quitline?: Patient refused referral  Patient has been referred for addiction treatment: N/A. Pt only smokes marijuana, at this time there are no programs that specialize just marijuana use    Beather Arbour 03/07/2023, 10:33 AM

## 2023-03-07 NOTE — Progress Notes (Signed)
Brian Christian D/C'd Home per MD order.  Discussed with the patient and all questions fully answered.   An After Visit Summary was printed and given to the patient. Patient received prescription.  D/c education completed with patient including follow up instructions, medication list, d/c activities limitations if indicated, with other d/c instructions as indicated by MD - patient able to verbalize understanding, all questions fully answered. Patient denies SI/HI/AVH at discharged.   Patient instructed to return to ED, call 911, or call MD for any changes in condition.   Patient escorted to the main entrance, and D/C home via public transport auto. Patient received voucher for transportation.   [name] 03/07/2023 12:26 PM

## 2023-03-07 NOTE — [facility] Suicide Risk Assessment (Signed)
[facility] INPATIENT:  Family/Significant Other Suicide Prevention Education  Suicide Prevention Education:  Patient Refusal for Family/Significant Other Suicide Prevention Education: The patient Brian Christian has refused to provide written consent for family/significant other to be provided Family/Significant Other Suicide Prevention Education during admission and/or prior to discharge.  Physician notified.  Isabella Bowens 03/07/2023, 10:32 AM

## 2023-03-07 NOTE — Progress Notes (Signed)
Andreus walked from his room to the hallway to look at books on the bookcase. Casimiro started to look in the window of the dayroom on the 300 hall. Dandra goes to the nurse's station and demand the nurse call 911 for an ambulance. Charge nurse explain we have to have vitals signs first. Jadarious goes back and forth he wants to go to a real hospital. This is not a real hospital, call 911. RN takes [employer]. Donne appears to be anxious and irrigate and kept demanding to call 911 to go to the hospital. RN explain to patient Shiva  she needs to do an EKG. RN ask for another person Banker) to go in the room to do an EKG with Jomarie Longs.

## 2023-03-07 NOTE — [facility] Suicide Risk Assessment (Signed)
Suicide Risk Assessment  Discharge Assessment    [facility] Discharge Suicide Risk Assessment   Principal Problem: Delusion Discharge Diagnoses: Principal Problem:   Delusion Active Problems:   Undifferentiated schizophrenia  Reason for admission:   This is the first psychiatric admission/evaluation in this [facility] for this 42 year old AA male with prior hx of substance use disorder (cocaine/THC) & chronic mental health issues. Per chart review, patient is an established mental health patient & has been on multiple psychiatric medications. It is unknown if patient is ever compliant with his treatment regimen, has a home or may be homeless as he presents as a poor historian. Chart review has shown that patient was brought to the ED by the EMS after being found near a dumpster. After medical evaluation/stabilization, he was cleared & transferred to the [facility] for further psychiatric evaluation.treatments. His UDS was positive for cannabis & cocaine.   Total Time spent with patient: 30 minutes  Musculoskeletal: Strength & Muscle Tone: within normal limits Gait & Station: normal Patient leans: N/A  Psychiatric Specialty Exam  Presentation  General Appearance:  Casual; Disheveled  Eye Contact: Poor  Speech: Clear and Coherent; Slow  Speech Volume: Decreased  Handedness: Right  Mood and Affect  Mood: -- (Patient currently denies any symptoms of depression.)  Duration of Depression Symptoms: No data recorded Affect: Restricted  Thought Process  Thought Processes: Disorganized  Descriptions of Associations:Loose  Orientation:Partial (Oriented to self only.)  Thought Content:Delusions; Scattered  History of Schizophrenia/Schizoaffective disorder:Yes  Duration of Psychotic Symptoms:Greater than six months  Hallucinations:No data recorded Ideas of Reference:Delusions; Paranoia  Suicidal Thoughts:No data recorded Homicidal Thoughts:No data recorded  Sensorium   Memory: Immediate Poor; Recent Poor; Remote Poor  Judgment: Impaired  Insight: Lacking  Executive Functions  Concentration: Fair  Attention Span: Fair  Recall: Poor  Fund of Knowledge: Poor  Language: Fair  Psychomotor Activity  Psychomotor Activity:No data recorded  Assets  Assets: Desire for Improvement  Sleep  Sleep:No data recorded  Physical Exam: Physical Exam Vitals and nursing note reviewed.  HENT:     Head: Normocephalic.     Nose: Nose normal.     Mouth/Throat:     Mouth: Mucous membranes are moist.     Pharynx: Oropharynx is clear.  Eyes:     Conjunctiva/sclera: Conjunctivae normal.     Pupils: Pupils are equal, round, and reactive to light.  Cardiovascular:     Rate and Rhythm: Normal rate.     Pulses: Normal pulses.     Comments: Blood pressure 144/118, pulse 82.  Patient remains asymptomatic.  Nursing staff to recheck vital signs Pulmonary:     Effort: Pulmonary effort is normal.  Abdominal:     Palpations: Abdomen is soft.  Genitourinary:    Comments: Deferred Musculoskeletal:        General: Normal range of motion.     Cervical back: Normal range of motion.  Skin:    General: Skin is warm.  Neurological:     Mental Status: He is alert and oriented to person, place, and time.  Psychiatric:     Comments: Refusing medication and agitated    Review of Systems  Constitutional: Negative.   HENT: Negative.    Eyes: Negative.   Respiratory: Negative.    Cardiovascular: Negative.        History of hypertension. Blood pressure 144/118, pulse 82.  Patient remains asymptomatic.  Nursing staff to recheck vital signs   Gastrointestinal: Negative.   Genitourinary: Negative.  Musculoskeletal: Negative.   Skin: Negative.   Neurological: Negative.   Endo/Heme/Allergies: Negative.   Psychiatric/Behavioral:  Positive for substance abuse. The patient is nervous/anxious (Stable with medication) and has insomnia (Stable with medication).     Blood pressure (!) 144/118, pulse 82, temperature 98.4 F (36.9 C), temperature source Oral, resp. rate 20, height  (1.676 m), weight 87.8 kg, SpO2 99 %. Body mass index is 31.25 kg/m.  Mental Status Per Nursing Assessment::   On Admission:  NA  Demographic Factors:  Male, Low socioeconomic status, and Unemployed  Loss Factors: Decrease in vocational status and Financial problems/change in socioeconomic status  Historical Factors: NA  Risk Reduction Factors:   NA  Continued Clinical Symptoms:  Alcohol/Substance Abuse/Dependencies More than one psychiatric diagnosis Currently Psychotic Previous Psychiatric Diagnoses and Treatments Medical Diagnoses and Treatments/Surgeries  Cognitive Features That Contribute To Risk:  Polarized thinking    Suicide Risk:  Mild:  Suicidal ideation of limited frequency, intensity, duration, and specificity.  There are no identifiable plans, no associated intent, mild dysphoria and related symptoms, good self-control (both objective and subjective assessment), few other risk factors, and identifiable protective factors, including available and accessible social support.  Plan Of Care/Follow-up recommendations:  Discharge Recommendations:  The patient is being discharged with his family. Patient is to take his discharge medications as ordered.  See follow up above. We recommend that he participates in individual therapy to target uncontrollable agitation and substance abuse.  We recommend that he participates in family therapy to target the conflict with his family, to improve communication skills and conflict resolution skills.  Family is to initiate/implement a contingency based behavioral model to address patient's behavior. We recommend that he gets AIMS scale, height, weight, blood pressure, fasting lipid panel, fasting blood sugar in three months from discharge if he's on atypical antipsychotics.  Patient will benefit from monitoring  of recurrent suicidal ideation since patient is on antidepressant medication. The patient should abstain from all illicit substances and alcohol.  If the patient's symptoms worsen or do not continue to improve or if the patient becomes actively suicidal or homicidal then it is recommended that the patient return to the closest hospital emergency room or call 911 for further evaluation and treatment. National Suicide Prevention Lifeline 1800-SUICIDE or [phone]. Please follow up with your primary medical doctor for all other medical needs.  The patient has been educated on the possible side effects to medications and he/his guardian is to contact a medical professional and inform outpatient provider of any new side effects of medication. He is to take regular diet and activity as tolerated.  Will benefit from moderate daily exercise. Patient/Family was educated about removing/locking any firearms, medications or dangerous products from the home.   Activity:  As tolerated Diet:  Regular no added salt  Cecilie Lowers, FNP 03/07/2023, 10:05 AM

## 2023-03-07 NOTE — Discharge Summary (Signed)
Physician Discharge Summary Note  Patient:  Brian Christian is an 42 y.o., male MRN:  [id number] DOB:  12-Feb-1981 Patient phone:  [phone] (home)  Patient address:   [address] [place] [address],   Total Time spent with patient: 30 minutes  Date of Admission:  03/04/2023 Date of Discharge:   03/07/2023  Reason for Admission:   Reason for admission:   This is the first psychiatric admission/evaluation in this [facility] for this 42 year old AA male with prior hx of substance use disorder (cocaine/THC) & chronic mental health issues. Per chart review, patient is an established mental health patient & has been on multiple psychiatric medications. It is unknown if patient is ever compliant with his treatment regimen, has a home or may be homeless as he presents as a poor historian. Chart review has shown that patient was brought to the ED by the EMS after being found near a dumpster. After medical evaluation/stabilization, he was cleared & transferred to the [facility] for further psychiatric evaluation.treatments. His UDS was positive for cannabis & cocaine.   Principal Problem: Delusion Discharge Diagnoses: Principal Problem:   Delusion Active Problems:   Undifferentiated schizophrenia  Past Psychiatric History:  Chart review indicated patient is an established mental health patient. Chart review reports indicated he has been to other psychiatric hospitals within the surrounding areas.   Past Medical History:  Past Medical History:  Diagnosis Date   HIV (human immunodeficiency virus infection)    Stroke    History reviewed. No pertinent surgical history.  Family History: History reviewed. No pertinent family history.  Family Psychiatric  History: Unsure, patient is unable to provide this information.   Social History:  Social History   Substance and Sexual Activity  Alcohol Use Yes   Comment: ocassionally     Social History   Substance and Sexual Activity  Drug Use Yes    Types: Marijuana    Social History   Socioeconomic History   Marital status: Single    Spouse name: Not on file   Number of children: Not on file   Years of education: Not on file   Highest education level: Not on file  Occupational History   Not on file  Tobacco Use   Smoking status: Every Day    Packs/day: .5    Types: Cigarettes   Smokeless tobacco: Never  Substance and Sexual Activity   Alcohol use: Yes    Comment: ocassionally   Drug use: Yes    Types: Marijuana   Sexual activity: Not on file  Other Topics Concern   Not on file  Social History Narrative   Not on file   Social Determinants of Health   Financial Resource Strain: Not on file  Food Insecurity: Patient Unable To Answer (03/04/2023)   Hunger Vital Sign    Worried About Running Out of Food in the Last Year: Patient unable to answer    Ran Out of Food in the Last Year: Patient unable to answer  Transportation Needs: Patient Unable To Answer (03/04/2023)   PRAPARE - Transportation    Lack of Transportation (Medical): Patient unable to answer    Lack of Transportation (Non-Medical): Patient unable to answer  Physical Activity: Not on file  Stress: Not on file  Social Connections: Not on file    Hospital Course:  During the patient's hospitalization, patient had extensive initial psychiatric evaluation, and follow-up psychiatric evaluations every day.  Psychiatric diagnoses provided upon initial assessment:  Undifferentiated schizophrenia  Patient's psychiatric medications were adjusted on admission:  Abilify 5 mg p.o. x 2 days for mood control BuSpar 15 mg p.o. twice daily for anxiety Vistaril 25 milligram p.o. 3 times daily as needed for anxiety Depakote DR 500 mg p.o. twice daily for mood stabilization Trazodone 50 mg p.o. nightly as needed for insomnia  During the hospitalization, other adjustments were made to the patient's psychiatric medication regimen:  Abilify 10 mg p.o. daily  Patient's  care was discussed during the interdisciplinary team meeting every day during the hospitalization.  The patient denies having side effects to prescribed psychiatric medication.  Gradually, patient started adjusting to milieu. The patient was evaluated each day by a clinical provider to ascertain response to treatment. Improvement was noted by the patient's report of decreasing symptoms, improved sleep and appetite, affect, medication tolerance, behavior, and participation in unit programming.  Patient was asked each day to complete a self inventory noting mood, mental status, pain, new symptoms, anxiety and concerns.    Symptoms were reported as significantly decreased or resolved completely by discharge.   On day of discharge, the patient reports that their mood is stable. The patient denied having suicidal thoughts for more than 48 hours prior to discharge.  Patient denies having homicidal thoughts.  Patient denies having auditory hallucinations.  Patient denies any visual hallucinations or other symptoms of psychosis. The patient was motivated to continue taking medication with a goal of continued improvement in mental health.   The patient reports their target psychiatric symptoms of psychosis responded well to the psychiatric medications, and the patient reports overall benefit other psychiatric hospitalization. Supportive psychotherapy was provided to the patient. The patient also participated in regular group therapy while hospitalized. Coping skills, problem solving as well as relaxation therapies were also part of the unit programming.  Labs were reviewed with the patient, and abnormal results were discussed with the patient.  The patient is able to verbalize their individual safety plan to this provider.  # It is recommended to the patient to continue psychiatric medications as prescribed, after discharge from the hospital.    # It is recommended to the patient to follow up with your  outpatient psychiatric provider and PCP.  # It was discussed with the patient, the impact of alcohol, drugs, tobacco have been there overall psychiatric and medical wellbeing, and total abstinence from substance use was recommended the patient.ed.  # Prescriptions provided or sent directly to preferred pharmacy at discharge. Patient agreeable to plan. Given opportunity to ask questions. Appears to feel comfortable with discharge.    # In the event of worsening symptoms, the patient is instructed to call the crisis hotline, 911 and or go to the nearest ED for appropriate evaluation and treatment of symptoms. To follow-up with primary care provider for other medical issues, concerns and or health care needs  # Patient was discharged [facility] in [place] with a plan to follow up as noted below.  Addendum: Upon discharge, patient was very belligerent, malingering and cursing at the medical staff.  Reporting he would sue the staff for discharging him.  He refused taking his medications, stating that staff were going to poison him.  Medical staff reiterated to patient that another RN would administer his medications, however, patient continues to refuse all medications and only requesting for benzodiazepines.  He denies SI, HI, or AVH.  His psychotic symptoms we have minimized and patient was discharged as indicated above to [facility] in [place].  Physical Findings: AIMS:  , ,  ,  ,  CIWA:    COWS:     Musculoskeletal: Strength & Muscle Tone: within normal limits Gait & Station: normal Patient leans: N/A  Psychiatric Specialty Exam:  Presentation  General Appearance:  Casual; Disheveled  Eye Contact: Poor  Speech: Clear and Coherent; Slow  Speech Volume: Decreased  Handedness: Right  Mood and Affect  Mood: -- (Patient currently denies any symptoms of depression.)  Affect: Restricted  Thought Process  Thought Processes: Disorganized  Descriptions  of Associations:Loose  Orientation:Partial (Oriented to self only.)  Thought Content:Delusions; Scattered  History of Schizophrenia/Schizoaffective disorder:Yes  Duration of Psychotic Symptoms:Greater than six months  Hallucinations:No data recorded Ideas of Reference:Delusions; Paranoia  Suicidal Thoughts:No data recorded Homicidal Thoughts:No data recorded  Sensorium  Memory: Immediate Poor; Recent Poor; Remote Poor  Judgment: Impaired  Insight: Lacking  Executive Functions  Concentration: Fair  Attention Span: Fair  Recall: Poor  Fund of Knowledge: Poor  Language: Fair  Psychomotor Activity  Psychomotor Activity:No data recorded  Assets  Assets: Desire for Improvement  Sleep  Sleep:No data recorded  Physical Exam: Physical Exam Vitals and nursing note reviewed.  HENT:     Head: Normocephalic.     Nose: Nose normal.     Mouth/Throat:     Mouth: Mucous membranes are moist.     Pharynx: Oropharynx is clear.  Eyes:     Conjunctiva/sclera: Conjunctivae normal.     Pupils: Pupils are equal, round, and reactive to light.  Cardiovascular:     Rate and Rhythm: Normal rate.     Pulses: Normal pulses.  Pulmonary:     Effort: Pulmonary effort is normal.  Genitourinary:    Comments: Deferred Musculoskeletal:        General: Normal range of motion.     Cervical back: Normal range of motion.  Skin:    General: Skin is warm.  Neurological:     General: No focal deficit present.     Mental Status: He is alert and oriented to person, place, and time.  Psychiatric:     Comments: Refusing meds and agitated.  Denies SI HI AVH    Review of Systems  Constitutional: Negative.   HENT: Negative.    Eyes: Negative.   Respiratory: Negative.    Cardiovascular: Negative.   Gastrointestinal: Negative.   Genitourinary: Negative.   Musculoskeletal: Negative.   Skin: Negative.   Neurological: Negative.   Endo/Heme/Allergies: Negative.    Psychiatric/Behavioral:  Positive for substance abuse. The patient is nervous/anxious (Stable with medication) and has insomnia (Stable with medication).    Blood pressure (!) 144/118, pulse 82, temperature 98.4 F (36.9 C), temperature source Oral, resp. rate 20, height 5\' 6"  (1.676 m), weight 87.8 kg, SpO2 99 %. Body mass index is 31.25 kg/m.   Social History   Tobacco Use  Smoking Status Every Day   Packs/day: .5   Types: Cigarettes  Smokeless Tobacco Never   Tobacco Cessation:  A prescription for an FDA-approved tobacco cessation medication provided at discharge   Blood Alcohol level:  Lab Results  Component Value Date   [facility] <10 03/01/2023   ETH <5 08/12/2016    Metabolic Disorder Labs:  Lab Results  Component Value Date   HGBA1C 5.8 (H) 03/06/2023   MPG 119.76 03/06/2023   MPG 122.63 03/05/2023   Lab Results  Component Value Date   PROLACTIN 8.6 08/13/2016   Lab Results  Component Value Date   CHOL 97 03/05/2023   TRIG 42 03/05/2023   HDL 48 03/05/2023  CHOLHDL 2.0 03/05/2023   VLDL 8 03/05/2023   LDLCALC 41 03/05/2023   LDLCALC 78 08/13/2016    See Psychiatric Specialty Exam and Suicide Risk Assessment completed by Attending Physician prior to discharge.  Discharge destination:  Other:  [facility] in [place]  Is patient on multiple antipsychotic therapies at discharge:  No   Has Patient had three or more failed trials of antipsychotic monotherapy by history:  No  Recommended Plan for Multiple Antipsychotic Therapies: NA  Discharge Instructions     Diet - low sodium heart healthy   Complete by: As directed    Increase activity slowly   Complete by: As directed       Allergies as of 03/07/2023       Reactions   Olanzapine Swelling, Other (See Comments)   Other Anaphylaxis   PEANUTS, pt can tolerate walnuts & pecans   Peanut-containing Drug Products Swelling, Other (See Comments)   Tramadol Other (See Comments)   dizzy    Gabapentin Swelling, Other (See Comments)   Face swelling   Ketorolac Swelling   Haloperidol Lactate Rash, Other (See Comments)   Muscle stiffness & tightness        Medication List     STOP taking these medications    imiquimod 5 % cream Commonly known as: ALDARA   omeprazole 40 MG capsule Commonly known as: PRILOSEC Replaced by: pantoprazole 40 MG tablet       TAKE these medications      Indication  ARIPiprazole 10 MG tablet Commonly known as: ABILIFY Take 1 tablet (10 mg total) by mouth daily. Start taking on: March 08, 2023  Indication: Schizophrenia   atorvastatin 20 MG tablet Commonly known as: LIPITOR Take 1 tablet (20 mg total) by mouth at bedtime.  Indication: High Amount of Fats in the Blood   busPIRone 15 MG tablet Commonly known as: BUSPAR Take 15 mg by mouth 2 (two) times daily.  Indication: Anxiety Disorder, Major Depressive Disorder   carvedilol 3.125 MG tablet Commonly known as: COREG Take 1 tablet (3.125 mg total) by mouth daily after breakfast.  Indication: High Blood Pressure Disorder   darunavir 800 MG tablet Commonly known as: PREZISTA Take 1 tablet (800 mg total) by mouth daily with breakfast. Start taking on: March 08, 2023 What changed:  how much to take when to take this  Indication: HIV Disease   divalproex 500 MG DR tablet Commonly known as: DEPAKOTE Take 500 mg by mouth 2 (two) times daily.  Indication: Schizophrenia   Genvoya 150-150-200-10 MG Tabs tablet Generic drug: elvitegravir-cobicistat-emtricitabine-tenofovir Take 1 tablet by mouth daily with breakfast. Start taking on: March 08, 2023 What changed: Another medication with the same name was removed. Continue taking this medication, and follow the directions you see here.  Indication: HIV Disease   hydrOXYzine 25 MG tablet Commonly known as: ATARAX Take 1 tablet (25 mg total) by mouth 3 (three) times daily as needed for anxiety.  Indication: Feeling Anxious    melatonin 5 MG Tabs Take 1 tablet (5 mg total) by mouth at bedtime as needed.  Indication: Trouble Sleeping   nicotine 21 mg/24hr patch Commonly known as: NICODERM CQ - dosed in mg/24 hours Place 1 patch (21 mg total) onto the skin daily. Start taking on: March 08, 2023  Indication: Nicotine Addiction   pantoprazole 40 MG tablet Commonly known as: PROTONIX Take 1 tablet (40 mg total) by mouth daily. Start taking on: March 08, 2023 Replaces: omeprazole 40 MG capsule  Indication: Gastroesophageal Reflux Disease   Ventolin HFA 108 (90 Base) MCG/ACT inhaler Generic drug: albuterol Inhale 1-2 puffs into the lungs every 4 (four) hours as needed for wheezing or shortness of breath.  Indication: Asthma        Follow-up recommendations:   Discharge Recommendations:  The patient is being discharged with his family. Patient is to take his discharge medications as ordered.  See follow up above. We recommend that he participates in individual therapy to target uncontrollable agitation and substance abuse.  We recommend that he participates in family therapy to target the conflict with his family, to improve communication skills and conflict resolution skills.  Family is to initiate/implement a contingency based behavioral model to address patient's behavior. We recommend that he gets AIMS scale, height, weight, blood pressure, fasting lipid panel, fasting blood sugar in three months from discharge if he's on atypical antipsychotics.  Patient will benefit from monitoring of recurrent suicidal ideation since patient is on antidepressant medication. The patient should abstain from all illicit substances and alcohol.  If the patient's symptoms worsen or do not continue to improve or if the patient becomes actively suicidal or homicidal then it is recommended that the patient return to the closest hospital emergency room or call 911 for further evaluation and treatment. National Suicide Prevention  Lifeline 1800-SUICIDE or 640-026-7617. Please follow up with your primary medical doctor for all other medical needs.  The patient has been educated on the possible side effects to medications and he/his guardian is to contact a medical professional and inform outpatient provider of any new side effects of medication. He is to take regular diet and activity as tolerated.  Will benefit from moderate daily exercise. Patient/Family was educated about removing/locking any firearms, medications or dangerous products from the home.   Activity:  As tolerated Diet:  Regular no added salt  Signed: Cecilie Lowers, FNP 03/07/2023, 10:19 AM

## 2023-03-07 NOTE — Progress Notes (Signed)
)  Pt up at 0530 requesting coffee and asking what time day room opened.  At 0540 Pt approached nurses station and stated "call 911"  This writer asked Pt what was wrong and Pt stated that he had chest pain.  Pt's nurse informed and VS taken but Pt agitated and not still for this.  See recorded reading on flowsheet.  Pt refused manual BP.  EKG performed by RN and was normal.  Pt stated "I am God, you know this now.  Everybody is going to bow"  Pt then stated that his voice could cause our death.  Pt cooperative with EKG while stating this.  After EKG dayroom open and Pt went in for coffee.  Pt currently in dayroom drinking coffee in no apparent distress.  NP aware, will continue to monitor.

## 2023-03-07 NOTE — Progress Notes (Signed)
Brian Christian has been walking back and forth from his room to the hallway from 2:45 am until 4:30 am. Brian Christian wants to know why am I coming in his damn room. Writer expained the safety checks will continue every 15 minutes. RN notify

## 2023-06-30 NOTE — Progress Notes (Signed)
Patient presented for acute psychosis.  Ethanol and UDS were ordered to evaluate for cause of his psychosis.
